# Patient Record
Sex: Male | Born: 1935
Health system: Southern US, Community
[De-identification: ages and names within clinical notes are randomized; demographics above are authoritative.]

## PROBLEM LIST (undated history)

## (undated) DIAGNOSIS — E119 Type 2 diabetes mellitus without complications: Secondary | ICD-10-CM

## (undated) DIAGNOSIS — I10 Essential (primary) hypertension: Secondary | ICD-10-CM

## (undated) DIAGNOSIS — M1990 Unspecified osteoarthritis, unspecified site: Secondary | ICD-10-CM

## (undated) HISTORY — DX: Unspecified osteoarthritis, unspecified site: M19.90

## (undated) HISTORY — DX: Type 2 diabetes mellitus without complications: E11.9

---

## 1998-11-12 ENCOUNTER — Inpatient Hospital Stay (HOSPITAL_COMMUNITY): Admission: EM | Admit: 1998-11-12 | Discharge: 1998-11-16 | Payer: Self-pay

## 1998-11-15 ENCOUNTER — Encounter: Payer: Self-pay | Admitting: Internal Medicine

## 1998-11-20 ENCOUNTER — Encounter: Admission: RE | Admit: 1998-11-20 | Discharge: 1999-02-11 | Payer: Self-pay | Admitting: Family Medicine

## 1998-11-30 ENCOUNTER — Inpatient Hospital Stay (HOSPITAL_COMMUNITY): Admission: EM | Admit: 1998-11-30 | Discharge: 1998-12-05 | Payer: Self-pay | Admitting: Internal Medicine

## 1998-12-04 ENCOUNTER — Encounter: Payer: Self-pay | Admitting: Gastroenterology

## 2009-02-03 ENCOUNTER — Emergency Department (HOSPITAL_COMMUNITY): Admission: EM | Admit: 2009-02-03 | Discharge: 2009-02-03 | Payer: Self-pay | Admitting: Emergency Medicine

## 2011-10-06 DIAGNOSIS — I10 Essential (primary) hypertension: Secondary | ICD-10-CM | POA: Diagnosis not present

## 2011-10-06 DIAGNOSIS — E119 Type 2 diabetes mellitus without complications: Secondary | ICD-10-CM | POA: Diagnosis not present

## 2011-10-06 DIAGNOSIS — E785 Hyperlipidemia, unspecified: Secondary | ICD-10-CM | POA: Diagnosis not present

## 2011-10-21 DIAGNOSIS — D649 Anemia, unspecified: Secondary | ICD-10-CM | POA: Diagnosis not present

## 2011-11-28 DIAGNOSIS — H2510 Age-related nuclear cataract, unspecified eye: Secondary | ICD-10-CM | POA: Diagnosis not present

## 2011-11-28 DIAGNOSIS — E119 Type 2 diabetes mellitus without complications: Secondary | ICD-10-CM | POA: Diagnosis not present

## 2012-04-07 DIAGNOSIS — E119 Type 2 diabetes mellitus without complications: Secondary | ICD-10-CM | POA: Diagnosis not present

## 2012-04-07 DIAGNOSIS — Z79899 Other long term (current) drug therapy: Secondary | ICD-10-CM | POA: Diagnosis not present

## 2012-04-07 DIAGNOSIS — I10 Essential (primary) hypertension: Secondary | ICD-10-CM | POA: Diagnosis not present

## 2012-05-18 DIAGNOSIS — D1801 Hemangioma of skin and subcutaneous tissue: Secondary | ICD-10-CM | POA: Diagnosis not present

## 2012-05-18 DIAGNOSIS — Z85828 Personal history of other malignant neoplasm of skin: Secondary | ICD-10-CM | POA: Diagnosis not present

## 2012-05-18 DIAGNOSIS — L819 Disorder of pigmentation, unspecified: Secondary | ICD-10-CM | POA: Diagnosis not present

## 2012-05-18 DIAGNOSIS — L821 Other seborrheic keratosis: Secondary | ICD-10-CM | POA: Diagnosis not present

## 2012-05-18 DIAGNOSIS — L570 Actinic keratosis: Secondary | ICD-10-CM | POA: Diagnosis not present

## 2012-07-08 DIAGNOSIS — I10 Essential (primary) hypertension: Secondary | ICD-10-CM | POA: Diagnosis not present

## 2012-07-08 DIAGNOSIS — Z79899 Other long term (current) drug therapy: Secondary | ICD-10-CM | POA: Diagnosis not present

## 2012-07-08 DIAGNOSIS — E119 Type 2 diabetes mellitus without complications: Secondary | ICD-10-CM | POA: Diagnosis not present

## 2012-10-06 DIAGNOSIS — E785 Hyperlipidemia, unspecified: Secondary | ICD-10-CM | POA: Diagnosis not present

## 2012-10-06 DIAGNOSIS — Z1331 Encounter for screening for depression: Secondary | ICD-10-CM | POA: Diagnosis not present

## 2012-10-06 DIAGNOSIS — Z23 Encounter for immunization: Secondary | ICD-10-CM | POA: Diagnosis not present

## 2012-10-06 DIAGNOSIS — I10 Essential (primary) hypertension: Secondary | ICD-10-CM | POA: Diagnosis not present

## 2012-10-25 DIAGNOSIS — E119 Type 2 diabetes mellitus without complications: Secondary | ICD-10-CM | POA: Diagnosis not present

## 2012-10-25 DIAGNOSIS — R7989 Other specified abnormal findings of blood chemistry: Secondary | ICD-10-CM | POA: Diagnosis not present

## 2012-11-15 DIAGNOSIS — E119 Type 2 diabetes mellitus without complications: Secondary | ICD-10-CM | POA: Diagnosis not present

## 2012-12-27 DIAGNOSIS — D485 Neoplasm of uncertain behavior of skin: Secondary | ICD-10-CM | POA: Diagnosis not present

## 2012-12-27 DIAGNOSIS — Z85828 Personal history of other malignant neoplasm of skin: Secondary | ICD-10-CM | POA: Diagnosis not present

## 2012-12-27 DIAGNOSIS — C44319 Basal cell carcinoma of skin of other parts of face: Secondary | ICD-10-CM | POA: Diagnosis not present

## 2013-01-13 DIAGNOSIS — C44319 Basal cell carcinoma of skin of other parts of face: Secondary | ICD-10-CM | POA: Diagnosis not present

## 2013-01-13 DIAGNOSIS — Z85828 Personal history of other malignant neoplasm of skin: Secondary | ICD-10-CM | POA: Diagnosis not present

## 2013-04-12 DIAGNOSIS — R7989 Other specified abnormal findings of blood chemistry: Secondary | ICD-10-CM | POA: Diagnosis not present

## 2013-04-12 DIAGNOSIS — E119 Type 2 diabetes mellitus without complications: Secondary | ICD-10-CM | POA: Diagnosis not present

## 2013-04-25 DIAGNOSIS — Z23 Encounter for immunization: Secondary | ICD-10-CM | POA: Diagnosis not present

## 2013-07-11 DIAGNOSIS — Z85828 Personal history of other malignant neoplasm of skin: Secondary | ICD-10-CM | POA: Diagnosis not present

## 2013-07-11 DIAGNOSIS — L570 Actinic keratosis: Secondary | ICD-10-CM | POA: Diagnosis not present

## 2013-07-11 DIAGNOSIS — L821 Other seborrheic keratosis: Secondary | ICD-10-CM | POA: Diagnosis not present

## 2013-09-23 DIAGNOSIS — E1139 Type 2 diabetes mellitus with other diabetic ophthalmic complication: Secondary | ICD-10-CM | POA: Diagnosis not present

## 2013-09-23 DIAGNOSIS — Z23 Encounter for immunization: Secondary | ICD-10-CM | POA: Diagnosis not present

## 2013-09-23 DIAGNOSIS — I10 Essential (primary) hypertension: Secondary | ICD-10-CM | POA: Diagnosis not present

## 2013-09-23 DIAGNOSIS — E7800 Pure hypercholesterolemia, unspecified: Secondary | ICD-10-CM | POA: Diagnosis not present

## 2013-10-11 DIAGNOSIS — E7800 Pure hypercholesterolemia, unspecified: Secondary | ICD-10-CM | POA: Diagnosis not present

## 2013-10-11 DIAGNOSIS — I10 Essential (primary) hypertension: Secondary | ICD-10-CM | POA: Diagnosis not present

## 2013-10-11 DIAGNOSIS — E1139 Type 2 diabetes mellitus with other diabetic ophthalmic complication: Secondary | ICD-10-CM | POA: Diagnosis not present

## 2013-10-11 DIAGNOSIS — Z23 Encounter for immunization: Secondary | ICD-10-CM | POA: Diagnosis not present

## 2013-10-20 DIAGNOSIS — D649 Anemia, unspecified: Secondary | ICD-10-CM | POA: Diagnosis not present

## 2013-10-21 DIAGNOSIS — Z1211 Encounter for screening for malignant neoplasm of colon: Secondary | ICD-10-CM | POA: Diagnosis not present

## 2013-11-15 DIAGNOSIS — H40039 Anatomical narrow angle, unspecified eye: Secondary | ICD-10-CM | POA: Diagnosis not present

## 2013-11-15 DIAGNOSIS — E119 Type 2 diabetes mellitus without complications: Secondary | ICD-10-CM | POA: Diagnosis not present

## 2013-11-22 ENCOUNTER — Other Ambulatory Visit: Payer: Self-pay | Admitting: Gastroenterology

## 2013-11-22 DIAGNOSIS — D500 Iron deficiency anemia secondary to blood loss (chronic): Secondary | ICD-10-CM | POA: Diagnosis not present

## 2013-11-22 DIAGNOSIS — Z800 Family history of malignant neoplasm of digestive organs: Secondary | ICD-10-CM | POA: Diagnosis not present

## 2013-11-22 DIAGNOSIS — R109 Unspecified abdominal pain: Secondary | ICD-10-CM

## 2013-11-22 DIAGNOSIS — D649 Anemia, unspecified: Secondary | ICD-10-CM

## 2013-11-25 ENCOUNTER — Ambulatory Visit
Admission: RE | Admit: 2013-11-25 | Discharge: 2013-11-25 | Disposition: A | Discharge status: Home / Self Care | Payer: Medicare Other | Source: Ambulatory Visit | Attending: Gastroenterology | Admitting: Gastroenterology

## 2013-11-25 DIAGNOSIS — D649 Anemia, unspecified: Secondary | ICD-10-CM

## 2013-11-25 DIAGNOSIS — N400 Enlarged prostate without lower urinary tract symptoms: Secondary | ICD-10-CM | POA: Diagnosis not present

## 2013-11-25 DIAGNOSIS — R109 Unspecified abdominal pain: Secondary | ICD-10-CM

## 2013-11-25 DIAGNOSIS — K838 Other specified diseases of biliary tract: Secondary | ICD-10-CM | POA: Diagnosis not present

## 2013-11-25 MED ORDER — IOHEXOL 300 MG/ML  SOLN
100.0000 mL | Freq: Once | INTRAMUSCULAR | Status: AC | PRN
  Administered 2013-11-25: 100 mL via INTRAVENOUS

## 2013-12-21 DIAGNOSIS — Z23 Encounter for immunization: Secondary | ICD-10-CM | POA: Diagnosis not present

## 2013-12-21 DIAGNOSIS — D649 Anemia, unspecified: Secondary | ICD-10-CM | POA: Diagnosis not present

## 2013-12-21 DIAGNOSIS — I10 Essential (primary) hypertension: Secondary | ICD-10-CM | POA: Diagnosis not present

## 2013-12-21 DIAGNOSIS — E1139 Type 2 diabetes mellitus with other diabetic ophthalmic complication: Secondary | ICD-10-CM | POA: Diagnosis not present

## 2014-01-02 DIAGNOSIS — L821 Other seborrheic keratosis: Secondary | ICD-10-CM | POA: Diagnosis not present

## 2014-01-02 DIAGNOSIS — Z85828 Personal history of other malignant neoplasm of skin: Secondary | ICD-10-CM | POA: Diagnosis not present

## 2014-01-02 DIAGNOSIS — L570 Actinic keratosis: Secondary | ICD-10-CM | POA: Diagnosis not present

## 2014-01-10 DIAGNOSIS — D500 Iron deficiency anemia secondary to blood loss (chronic): Secondary | ICD-10-CM | POA: Diagnosis not present

## 2014-01-10 DIAGNOSIS — R932 Abnormal findings on diagnostic imaging of liver and biliary tract: Secondary | ICD-10-CM | POA: Diagnosis not present

## 2014-01-24 DIAGNOSIS — R932 Abnormal findings on diagnostic imaging of liver and biliary tract: Secondary | ICD-10-CM | POA: Diagnosis not present

## 2014-01-24 DIAGNOSIS — D500 Iron deficiency anemia secondary to blood loss (chronic): Secondary | ICD-10-CM | POA: Diagnosis not present

## 2014-03-21 DIAGNOSIS — D500 Iron deficiency anemia secondary to blood loss (chronic): Secondary | ICD-10-CM | POA: Diagnosis not present

## 2014-04-12 DIAGNOSIS — E7800 Pure hypercholesterolemia, unspecified: Secondary | ICD-10-CM | POA: Diagnosis not present

## 2014-04-14 DIAGNOSIS — E139 Other specified diabetes mellitus without complications: Secondary | ICD-10-CM | POA: Diagnosis not present

## 2014-05-03 DIAGNOSIS — Z23 Encounter for immunization: Secondary | ICD-10-CM | POA: Diagnosis not present

## 2014-07-17 DIAGNOSIS — Z85828 Personal history of other malignant neoplasm of skin: Secondary | ICD-10-CM | POA: Diagnosis not present

## 2014-07-17 DIAGNOSIS — D1801 Hemangioma of skin and subcutaneous tissue: Secondary | ICD-10-CM | POA: Diagnosis not present

## 2014-07-17 DIAGNOSIS — L570 Actinic keratosis: Secondary | ICD-10-CM | POA: Diagnosis not present

## 2014-07-17 DIAGNOSIS — C44519 Basal cell carcinoma of skin of other part of trunk: Secondary | ICD-10-CM | POA: Diagnosis not present

## 2014-07-17 DIAGNOSIS — D485 Neoplasm of uncertain behavior of skin: Secondary | ICD-10-CM | POA: Diagnosis not present

## 2014-07-17 DIAGNOSIS — L812 Freckles: Secondary | ICD-10-CM | POA: Diagnosis not present

## 2014-07-17 DIAGNOSIS — L821 Other seborrheic keratosis: Secondary | ICD-10-CM | POA: Diagnosis not present

## 2014-09-21 DIAGNOSIS — I10 Essential (primary) hypertension: Secondary | ICD-10-CM | POA: Diagnosis not present

## 2014-09-21 DIAGNOSIS — E782 Mixed hyperlipidemia: Secondary | ICD-10-CM | POA: Diagnosis not present

## 2014-09-21 DIAGNOSIS — E119 Type 2 diabetes mellitus without complications: Secondary | ICD-10-CM | POA: Diagnosis not present

## 2014-09-26 DIAGNOSIS — H60393 Other infective otitis externa, bilateral: Secondary | ICD-10-CM | POA: Diagnosis not present

## 2014-09-26 DIAGNOSIS — H9193 Unspecified hearing loss, bilateral: Secondary | ICD-10-CM | POA: Diagnosis not present

## 2014-09-28 DIAGNOSIS — D649 Anemia, unspecified: Secondary | ICD-10-CM | POA: Diagnosis not present

## 2014-11-14 DIAGNOSIS — H40033 Anatomical narrow angle, bilateral: Secondary | ICD-10-CM | POA: Diagnosis not present

## 2014-11-14 DIAGNOSIS — H04123 Dry eye syndrome of bilateral lacrimal glands: Secondary | ICD-10-CM | POA: Diagnosis not present

## 2015-01-02 DIAGNOSIS — D649 Anemia, unspecified: Secondary | ICD-10-CM | POA: Diagnosis not present

## 2015-01-05 DIAGNOSIS — D500 Iron deficiency anemia secondary to blood loss (chronic): Secondary | ICD-10-CM | POA: Diagnosis not present

## 2015-01-12 DIAGNOSIS — D500 Iron deficiency anemia secondary to blood loss (chronic): Secondary | ICD-10-CM | POA: Diagnosis not present

## 2015-01-23 DIAGNOSIS — L821 Other seborrheic keratosis: Secondary | ICD-10-CM | POA: Diagnosis not present

## 2015-01-23 DIAGNOSIS — D1801 Hemangioma of skin and subcutaneous tissue: Secondary | ICD-10-CM | POA: Diagnosis not present

## 2015-01-23 DIAGNOSIS — Z85828 Personal history of other malignant neoplasm of skin: Secondary | ICD-10-CM | POA: Diagnosis not present

## 2015-01-23 DIAGNOSIS — L820 Inflamed seborrheic keratosis: Secondary | ICD-10-CM | POA: Diagnosis not present

## 2015-03-26 DIAGNOSIS — M25562 Pain in left knee: Secondary | ICD-10-CM | POA: Diagnosis not present

## 2015-03-26 DIAGNOSIS — M170 Bilateral primary osteoarthritis of knee: Secondary | ICD-10-CM | POA: Diagnosis not present

## 2015-03-26 DIAGNOSIS — M25561 Pain in right knee: Secondary | ICD-10-CM | POA: Diagnosis not present

## 2015-03-26 DIAGNOSIS — R262 Difficulty in walking, not elsewhere classified: Secondary | ICD-10-CM | POA: Diagnosis not present

## 2015-04-05 DIAGNOSIS — M25562 Pain in left knee: Secondary | ICD-10-CM | POA: Diagnosis not present

## 2015-04-05 DIAGNOSIS — M1712 Unilateral primary osteoarthritis, left knee: Secondary | ICD-10-CM | POA: Diagnosis not present

## 2015-04-05 DIAGNOSIS — R2689 Other abnormalities of gait and mobility: Secondary | ICD-10-CM | POA: Diagnosis not present

## 2015-04-05 DIAGNOSIS — M25561 Pain in right knee: Secondary | ICD-10-CM | POA: Diagnosis not present

## 2015-04-05 DIAGNOSIS — M170 Bilateral primary osteoarthritis of knee: Secondary | ICD-10-CM | POA: Diagnosis not present

## 2015-04-13 DIAGNOSIS — M25562 Pain in left knee: Secondary | ICD-10-CM | POA: Diagnosis not present

## 2015-04-13 DIAGNOSIS — M170 Bilateral primary osteoarthritis of knee: Secondary | ICD-10-CM | POA: Diagnosis not present

## 2015-04-13 DIAGNOSIS — R2689 Other abnormalities of gait and mobility: Secondary | ICD-10-CM | POA: Diagnosis not present

## 2015-04-13 DIAGNOSIS — M1712 Unilateral primary osteoarthritis, left knee: Secondary | ICD-10-CM | POA: Diagnosis not present

## 2015-04-13 DIAGNOSIS — M25561 Pain in right knee: Secondary | ICD-10-CM | POA: Diagnosis not present

## 2015-04-13 DIAGNOSIS — R262 Difficulty in walking, not elsewhere classified: Secondary | ICD-10-CM | POA: Diagnosis not present

## 2015-04-13 DIAGNOSIS — M1711 Unilateral primary osteoarthritis, right knee: Secondary | ICD-10-CM | POA: Diagnosis not present

## 2015-04-17 DIAGNOSIS — R2689 Other abnormalities of gait and mobility: Secondary | ICD-10-CM | POA: Diagnosis not present

## 2015-04-17 DIAGNOSIS — M170 Bilateral primary osteoarthritis of knee: Secondary | ICD-10-CM | POA: Diagnosis not present

## 2015-04-17 DIAGNOSIS — M25561 Pain in right knee: Secondary | ICD-10-CM | POA: Diagnosis not present

## 2015-04-17 DIAGNOSIS — M25562 Pain in left knee: Secondary | ICD-10-CM | POA: Diagnosis not present

## 2015-04-17 DIAGNOSIS — M1712 Unilateral primary osteoarthritis, left knee: Secondary | ICD-10-CM | POA: Diagnosis not present

## 2015-04-23 DIAGNOSIS — M1711 Unilateral primary osteoarthritis, right knee: Secondary | ICD-10-CM | POA: Diagnosis not present

## 2015-04-23 DIAGNOSIS — M25561 Pain in right knee: Secondary | ICD-10-CM | POA: Diagnosis not present

## 2015-04-23 DIAGNOSIS — R269 Unspecified abnormalities of gait and mobility: Secondary | ICD-10-CM | POA: Diagnosis not present

## 2015-04-23 DIAGNOSIS — M25562 Pain in left knee: Secondary | ICD-10-CM | POA: Diagnosis not present

## 2015-04-23 DIAGNOSIS — M170 Bilateral primary osteoarthritis of knee: Secondary | ICD-10-CM | POA: Diagnosis not present

## 2015-04-26 DIAGNOSIS — M25562 Pain in left knee: Secondary | ICD-10-CM | POA: Diagnosis not present

## 2015-04-26 DIAGNOSIS — M1712 Unilateral primary osteoarthritis, left knee: Secondary | ICD-10-CM | POA: Diagnosis not present

## 2015-05-01 DIAGNOSIS — M1711 Unilateral primary osteoarthritis, right knee: Secondary | ICD-10-CM | POA: Diagnosis not present

## 2015-05-01 DIAGNOSIS — M25561 Pain in right knee: Secondary | ICD-10-CM | POA: Diagnosis not present

## 2015-05-04 DIAGNOSIS — Z23 Encounter for immunization: Secondary | ICD-10-CM | POA: Diagnosis not present

## 2015-05-10 DIAGNOSIS — R2689 Other abnormalities of gait and mobility: Secondary | ICD-10-CM | POA: Diagnosis not present

## 2015-05-10 DIAGNOSIS — M170 Bilateral primary osteoarthritis of knee: Secondary | ICD-10-CM | POA: Diagnosis not present

## 2015-05-10 DIAGNOSIS — M25562 Pain in left knee: Secondary | ICD-10-CM | POA: Diagnosis not present

## 2015-05-10 DIAGNOSIS — M1712 Unilateral primary osteoarthritis, left knee: Secondary | ICD-10-CM | POA: Diagnosis not present

## 2015-05-10 DIAGNOSIS — M25561 Pain in right knee: Secondary | ICD-10-CM | POA: Diagnosis not present

## 2015-05-18 DIAGNOSIS — M1711 Unilateral primary osteoarthritis, right knee: Secondary | ICD-10-CM | POA: Diagnosis not present

## 2015-05-18 DIAGNOSIS — M25561 Pain in right knee: Secondary | ICD-10-CM | POA: Diagnosis not present

## 2015-05-18 DIAGNOSIS — M170 Bilateral primary osteoarthritis of knee: Secondary | ICD-10-CM | POA: Diagnosis not present

## 2015-05-18 DIAGNOSIS — M25562 Pain in left knee: Secondary | ICD-10-CM | POA: Diagnosis not present

## 2015-05-18 DIAGNOSIS — R2689 Other abnormalities of gait and mobility: Secondary | ICD-10-CM | POA: Diagnosis not present

## 2015-08-13 DIAGNOSIS — L821 Other seborrheic keratosis: Secondary | ICD-10-CM | POA: Diagnosis not present

## 2015-08-13 DIAGNOSIS — C44510 Basal cell carcinoma of anal skin: Secondary | ICD-10-CM | POA: Diagnosis not present

## 2015-08-13 DIAGNOSIS — L218 Other seborrheic dermatitis: Secondary | ICD-10-CM | POA: Diagnosis not present

## 2015-08-13 DIAGNOSIS — L910 Hypertrophic scar: Secondary | ICD-10-CM | POA: Diagnosis not present

## 2015-08-13 DIAGNOSIS — Z85828 Personal history of other malignant neoplasm of skin: Secondary | ICD-10-CM | POA: Diagnosis not present

## 2015-08-13 DIAGNOSIS — D692 Other nonthrombocytopenic purpura: Secondary | ICD-10-CM | POA: Diagnosis not present

## 2015-08-13 DIAGNOSIS — D485 Neoplasm of uncertain behavior of skin: Secondary | ICD-10-CM | POA: Diagnosis not present

## 2015-08-13 DIAGNOSIS — C44519 Basal cell carcinoma of skin of other part of trunk: Secondary | ICD-10-CM | POA: Diagnosis not present

## 2015-10-18 DIAGNOSIS — D649 Anemia, unspecified: Secondary | ICD-10-CM | POA: Diagnosis not present

## 2015-10-18 DIAGNOSIS — E782 Mixed hyperlipidemia: Secondary | ICD-10-CM | POA: Diagnosis not present

## 2015-10-18 DIAGNOSIS — R809 Proteinuria, unspecified: Secondary | ICD-10-CM | POA: Diagnosis not present

## 2015-10-18 DIAGNOSIS — E119 Type 2 diabetes mellitus without complications: Secondary | ICD-10-CM | POA: Diagnosis not present

## 2015-10-18 DIAGNOSIS — Z7984 Long term (current) use of oral hypoglycemic drugs: Secondary | ICD-10-CM | POA: Diagnosis not present

## 2015-10-18 DIAGNOSIS — I10 Essential (primary) hypertension: Secondary | ICD-10-CM | POA: Diagnosis not present

## 2015-11-13 DIAGNOSIS — H40033 Anatomical narrow angle, bilateral: Secondary | ICD-10-CM | POA: Diagnosis not present

## 2015-11-13 DIAGNOSIS — E119 Type 2 diabetes mellitus without complications: Secondary | ICD-10-CM | POA: Diagnosis not present

## 2016-02-11 DIAGNOSIS — C44519 Basal cell carcinoma of skin of other part of trunk: Secondary | ICD-10-CM | POA: Diagnosis not present

## 2016-02-11 DIAGNOSIS — L821 Other seborrheic keratosis: Secondary | ICD-10-CM | POA: Diagnosis not present

## 2016-02-11 DIAGNOSIS — C44219 Basal cell carcinoma of skin of left ear and external auricular canal: Secondary | ICD-10-CM | POA: Diagnosis not present

## 2016-02-11 DIAGNOSIS — L812 Freckles: Secondary | ICD-10-CM | POA: Diagnosis not present

## 2016-02-11 DIAGNOSIS — D485 Neoplasm of uncertain behavior of skin: Secondary | ICD-10-CM | POA: Diagnosis not present

## 2016-02-11 DIAGNOSIS — L308 Other specified dermatitis: Secondary | ICD-10-CM | POA: Diagnosis not present

## 2016-02-11 DIAGNOSIS — D1801 Hemangioma of skin and subcutaneous tissue: Secondary | ICD-10-CM | POA: Diagnosis not present

## 2016-02-11 DIAGNOSIS — Z85828 Personal history of other malignant neoplasm of skin: Secondary | ICD-10-CM | POA: Diagnosis not present

## 2016-02-13 DIAGNOSIS — R809 Proteinuria, unspecified: Secondary | ICD-10-CM | POA: Diagnosis not present

## 2016-02-13 DIAGNOSIS — E119 Type 2 diabetes mellitus without complications: Secondary | ICD-10-CM | POA: Diagnosis not present

## 2016-02-13 DIAGNOSIS — I10 Essential (primary) hypertension: Secondary | ICD-10-CM | POA: Diagnosis not present

## 2016-02-13 DIAGNOSIS — Z7984 Long term (current) use of oral hypoglycemic drugs: Secondary | ICD-10-CM | POA: Diagnosis not present

## 2016-02-13 DIAGNOSIS — E782 Mixed hyperlipidemia: Secondary | ICD-10-CM | POA: Diagnosis not present

## 2016-02-13 DIAGNOSIS — D649 Anemia, unspecified: Secondary | ICD-10-CM | POA: Diagnosis not present

## 2016-02-18 DIAGNOSIS — Z85828 Personal history of other malignant neoplasm of skin: Secondary | ICD-10-CM | POA: Diagnosis not present

## 2016-02-18 DIAGNOSIS — D485 Neoplasm of uncertain behavior of skin: Secondary | ICD-10-CM | POA: Diagnosis not present

## 2016-02-18 DIAGNOSIS — L821 Other seborrheic keratosis: Secondary | ICD-10-CM | POA: Diagnosis not present

## 2016-04-22 DIAGNOSIS — Z23 Encounter for immunization: Secondary | ICD-10-CM | POA: Diagnosis not present

## 2016-05-10 DIAGNOSIS — H9192 Unspecified hearing loss, left ear: Secondary | ICD-10-CM | POA: Diagnosis not present

## 2016-05-10 DIAGNOSIS — H6122 Impacted cerumen, left ear: Secondary | ICD-10-CM | POA: Diagnosis not present

## 2016-05-24 DIAGNOSIS — H6122 Impacted cerumen, left ear: Secondary | ICD-10-CM | POA: Diagnosis not present

## 2016-05-24 DIAGNOSIS — H6980 Other specified disorders of Eustachian tube, unspecified ear: Secondary | ICD-10-CM | POA: Diagnosis not present

## 2016-06-03 DIAGNOSIS — E119 Type 2 diabetes mellitus without complications: Secondary | ICD-10-CM | POA: Diagnosis not present

## 2016-06-03 DIAGNOSIS — Z7984 Long term (current) use of oral hypoglycemic drugs: Secondary | ICD-10-CM | POA: Diagnosis not present

## 2016-06-03 DIAGNOSIS — E782 Mixed hyperlipidemia: Secondary | ICD-10-CM | POA: Diagnosis not present

## 2016-06-09 DIAGNOSIS — E119 Type 2 diabetes mellitus without complications: Secondary | ICD-10-CM | POA: Diagnosis not present

## 2016-06-09 DIAGNOSIS — E782 Mixed hyperlipidemia: Secondary | ICD-10-CM | POA: Diagnosis not present

## 2016-06-09 DIAGNOSIS — Z7984 Long term (current) use of oral hypoglycemic drugs: Secondary | ICD-10-CM | POA: Diagnosis not present

## 2016-08-11 DIAGNOSIS — D1801 Hemangioma of skin and subcutaneous tissue: Secondary | ICD-10-CM | POA: Diagnosis not present

## 2016-08-11 DIAGNOSIS — D485 Neoplasm of uncertain behavior of skin: Secondary | ICD-10-CM | POA: Diagnosis not present

## 2016-08-11 DIAGNOSIS — D044 Carcinoma in situ of skin of scalp and neck: Secondary | ICD-10-CM | POA: Diagnosis not present

## 2016-08-11 DIAGNOSIS — L570 Actinic keratosis: Secondary | ICD-10-CM | POA: Diagnosis not present

## 2016-08-11 DIAGNOSIS — L821 Other seborrheic keratosis: Secondary | ICD-10-CM | POA: Diagnosis not present

## 2016-08-11 DIAGNOSIS — Z85828 Personal history of other malignant neoplasm of skin: Secondary | ICD-10-CM | POA: Diagnosis not present

## 2016-10-02 DIAGNOSIS — H6123 Impacted cerumen, bilateral: Secondary | ICD-10-CM | POA: Diagnosis not present

## 2016-11-11 DIAGNOSIS — H40033 Anatomical narrow angle, bilateral: Secondary | ICD-10-CM | POA: Diagnosis not present

## 2016-11-11 DIAGNOSIS — H04123 Dry eye syndrome of bilateral lacrimal glands: Secondary | ICD-10-CM | POA: Diagnosis not present

## 2016-12-01 DIAGNOSIS — I10 Essential (primary) hypertension: Secondary | ICD-10-CM | POA: Diagnosis not present

## 2016-12-01 DIAGNOSIS — Z7984 Long term (current) use of oral hypoglycemic drugs: Secondary | ICD-10-CM | POA: Diagnosis not present

## 2016-12-01 DIAGNOSIS — E782 Mixed hyperlipidemia: Secondary | ICD-10-CM | POA: Diagnosis not present

## 2016-12-01 DIAGNOSIS — E119 Type 2 diabetes mellitus without complications: Secondary | ICD-10-CM | POA: Diagnosis not present

## 2017-02-16 DIAGNOSIS — L812 Freckles: Secondary | ICD-10-CM | POA: Diagnosis not present

## 2017-02-16 DIAGNOSIS — L853 Xerosis cutis: Secondary | ICD-10-CM | POA: Diagnosis not present

## 2017-02-16 DIAGNOSIS — D1801 Hemangioma of skin and subcutaneous tissue: Secondary | ICD-10-CM | POA: Diagnosis not present

## 2017-02-16 DIAGNOSIS — L821 Other seborrheic keratosis: Secondary | ICD-10-CM | POA: Diagnosis not present

## 2017-02-16 DIAGNOSIS — L570 Actinic keratosis: Secondary | ICD-10-CM | POA: Diagnosis not present

## 2017-02-16 DIAGNOSIS — Z85828 Personal history of other malignant neoplasm of skin: Secondary | ICD-10-CM | POA: Diagnosis not present

## 2017-04-22 DIAGNOSIS — Z23 Encounter for immunization: Secondary | ICD-10-CM | POA: Diagnosis not present

## 2017-07-13 DIAGNOSIS — R609 Edema, unspecified: Secondary | ICD-10-CM | POA: Diagnosis not present

## 2017-07-13 DIAGNOSIS — R208 Other disturbances of skin sensation: Secondary | ICD-10-CM | POA: Diagnosis not present

## 2017-07-24 DIAGNOSIS — Z23 Encounter for immunization: Secondary | ICD-10-CM | POA: Diagnosis not present

## 2017-07-24 DIAGNOSIS — E119 Type 2 diabetes mellitus without complications: Secondary | ICD-10-CM | POA: Diagnosis not present

## 2017-07-24 DIAGNOSIS — E782 Mixed hyperlipidemia: Secondary | ICD-10-CM | POA: Diagnosis not present

## 2017-07-24 DIAGNOSIS — H9190 Unspecified hearing loss, unspecified ear: Secondary | ICD-10-CM | POA: Diagnosis not present

## 2017-07-24 DIAGNOSIS — Z7984 Long term (current) use of oral hypoglycemic drugs: Secondary | ICD-10-CM | POA: Diagnosis not present

## 2017-07-24 DIAGNOSIS — I10 Essential (primary) hypertension: Secondary | ICD-10-CM | POA: Diagnosis not present

## 2017-07-24 DIAGNOSIS — Z0000 Encounter for general adult medical examination without abnormal findings: Secondary | ICD-10-CM | POA: Diagnosis not present

## 2017-07-24 DIAGNOSIS — H6120 Impacted cerumen, unspecified ear: Secondary | ICD-10-CM | POA: Diagnosis not present

## 2017-08-27 DIAGNOSIS — L821 Other seborrheic keratosis: Secondary | ICD-10-CM | POA: Diagnosis not present

## 2017-08-27 DIAGNOSIS — L812 Freckles: Secondary | ICD-10-CM | POA: Diagnosis not present

## 2017-08-27 DIAGNOSIS — D692 Other nonthrombocytopenic purpura: Secondary | ICD-10-CM | POA: Diagnosis not present

## 2017-08-27 DIAGNOSIS — Z85828 Personal history of other malignant neoplasm of skin: Secondary | ICD-10-CM | POA: Diagnosis not present

## 2017-08-27 DIAGNOSIS — D1801 Hemangioma of skin and subcutaneous tissue: Secondary | ICD-10-CM | POA: Diagnosis not present

## 2017-08-27 DIAGNOSIS — L570 Actinic keratosis: Secondary | ICD-10-CM | POA: Diagnosis not present

## 2017-11-10 DIAGNOSIS — H04123 Dry eye syndrome of bilateral lacrimal glands: Secondary | ICD-10-CM | POA: Diagnosis not present

## 2017-11-10 DIAGNOSIS — H40033 Anatomical narrow angle, bilateral: Secondary | ICD-10-CM | POA: Diagnosis not present

## 2017-12-03 DIAGNOSIS — W57XXXA Bitten or stung by nonvenomous insect and other nonvenomous arthropods, initial encounter: Secondary | ICD-10-CM | POA: Diagnosis not present

## 2017-12-03 DIAGNOSIS — S30860A Insect bite (nonvenomous) of lower back and pelvis, initial encounter: Secondary | ICD-10-CM | POA: Diagnosis not present

## 2018-02-03 DIAGNOSIS — H9190 Unspecified hearing loss, unspecified ear: Secondary | ICD-10-CM | POA: Diagnosis not present

## 2018-02-03 DIAGNOSIS — E1169 Type 2 diabetes mellitus with other specified complication: Secondary | ICD-10-CM | POA: Diagnosis not present

## 2018-02-03 DIAGNOSIS — E782 Mixed hyperlipidemia: Secondary | ICD-10-CM | POA: Diagnosis not present

## 2018-02-03 DIAGNOSIS — I10 Essential (primary) hypertension: Secondary | ICD-10-CM | POA: Diagnosis not present

## 2018-02-03 DIAGNOSIS — M179 Osteoarthritis of knee, unspecified: Secondary | ICD-10-CM | POA: Diagnosis not present

## 2018-02-24 ENCOUNTER — Ambulatory Visit (INDEPENDENT_AMBULATORY_CARE_PROVIDER_SITE_OTHER): Payer: Medicare Other

## 2018-02-24 ENCOUNTER — Ambulatory Visit (INDEPENDENT_AMBULATORY_CARE_PROVIDER_SITE_OTHER): Payer: Medicare Other | Admitting: Sports Medicine

## 2018-02-24 ENCOUNTER — Encounter: Payer: Self-pay | Admitting: Sports Medicine

## 2018-02-24 DIAGNOSIS — M170 Bilateral primary osteoarthritis of knee: Secondary | ICD-10-CM

## 2018-02-24 MED ORDER — CELECOXIB 200 MG PO CAPS: ORAL_CAPSULE | ORAL | 2 refills | Status: DC

## 2018-02-24 NOTE — Assessment & Plan Note (Addendum)
Went to [facility], had a series of Supartz into both knees with only a few weeks of relief. Has not yet been on anti-inflammatories, has not had steroid injections, has not had physical therapy. Bilateral steroid injection today, Celebrex, home health physical therapy. Baseline x-rays.   We discussed the experimental nature of stem cell injections, and how it is not covered by insurance.   We discussed how it is probably a better option to do tried and true, proven modalities before considering something experimental. Return in 1 month.

## 2018-02-24 NOTE — Progress Notes (Signed)
Subjective:    CC: Bilateral knee pain  HPI:  Darin Moran is a very pleasant 84 year old male, he has known knee osteoarthritis.  He went to a local clinic and had a 5 shot Visco supplementation series that only provided a few weeks of relief.  He has not tried NSAIDs, and has not had steroid injections or physical therapy.  He only has minimal pain, more gelling and instability when standing.  No mechanical symptoms, no trauma, no constitutional symptoms.  I reviewed the past medical history, family history, social history, surgical history, and allergies today and no changes were needed.  Please see the problem list section below in epic for further details.  Past Medical History: Past Medical History:  Diagnosis Date  . Arthritis   . Diabetes mellitus without complication [facility])    Past Surgical History: History reviewed. No pertinent surgical history. Social History: Social History   Socioeconomic History  . Marital status: Married    Spouse name: Not on file  . Number of children: Not on file  . Years of education: Not on file  . Highest education level: Not on file  Occupational History  . Not on file  Social Needs  . Financial resource strain: Not on file  . Food insecurity:    Worry: Not on file    Inability: Not on file  . Transportation needs:    Medical: Not on file    Non-medical: Not on file  Tobacco Use  . Smoking status: Former Research scientist (life sciences)  . Smokeless tobacco: Never Used  Substance and Sexual Activity  . Alcohol use: Never    Frequency: Never  . Drug use: Never  . Sexual activity: Not Currently  Lifestyle  . Physical activity:    Days per week: Not on file    Minutes per session: Not on file  . Stress: Not on file  Relationships  . Social connections:    Talks on phone: Not on file    Gets together: Not on file    Attends religious service: Not on file    Active member of club or organization: Not on file    Attends meetings of clubs or organizations: Not  on file    Relationship status: Not on file  Other Topics Concern  . Not on file  Social History Narrative  . Not on file   Family History: Family History  Problem Relation Age of Onset  . Cancer Father        colon   Allergies: No Known Allergies Medications: See med rec.  Review of Systems: No headache, visual changes, nausea, vomiting, diarrhea, constipation, dizziness, abdominal pain, skin rash, fevers, chills, night sweats, swollen lymph nodes, weight loss, chest pain, body aches, joint swelling, muscle aches, shortness of breath, mood changes, visual or auditory hallucinations.  Objective:    General: Well Developed, well nourished, and in no acute distress.  Neuro: Alert and oriented x3, extra-ocular muscles intact, sensation grossly intact.  HEENT: Normocephalic, atraumatic, pupils equal round reactive to light, neck supple, no masses, no lymphadenopathy, thyroid nonpalpable.  Skin: Warm and dry, no rashes noted.  Cardiac: Regular rate and rhythm, no murmurs rubs or gallops.  Respiratory: Clear to auscultation bilaterally. Not using accessory muscles, speaking in full sentences.  Abdominal: Soft, nontender, nondistended, positive bowel sounds, no masses, no organomegaly.  Bilateral knees: Mild effusion with mild tenderness of the medial joint line ROM normal in flexion and extension and lower leg rotation. Ligaments with solid consistent endpoints including ACL,  PCL, LCL, MCL. Negative Mcmurray's and provocative meniscal tests. Non painful patellar compression. Patellar and quadriceps tendons unremarkable. Hamstring and quadriceps strength is normal.  Procedure: Real-time Ultrasound Guided Injection of right knee Device: GE Logiq E  Verbal informed consent obtained.  Time-out conducted.  Noted no overlying erythema, induration, or other signs of local infection.  Skin prepped in a sterile fashion.  Local anesthesia: Topical Ethyl chloride.  With sterile technique  and under real time ultrasound guidance:   1 cc kenalog 40, 2 cc lidocaine, 2 cc bupivacaine injected easily Completed without difficulty  Pain immediately resolved suggesting accurate placement of the medication.  Advised to call if fevers/chills, erythema, induration, drainage, or persistent bleeding.  Images permanently stored and available for review in the ultrasound unit.  Impression: Technically successful ultrasound guided injection.  Procedure: Real-time Ultrasound Guided Injection of left knee Device: GE Logiq E  Verbal informed consent obtained.  Time-out conducted.  Noted no overlying erythema, induration, or other signs of local infection.  Skin prepped in a sterile fashion.  Local anesthesia: Topical Ethyl chloride.  With sterile technique and under real time ultrasound guidance:   1 cc kenalog 40, 2 cc lidocaine, 2 cc bupivacaine injected easily Completed without difficulty  Pain immediately resolved suggesting accurate placement of the medication.  Advised to call if fevers/chills, erythema, induration, drainage, or persistent bleeding.  Images permanently stored and available for review in the ultrasound unit.  Impression: Technically successful ultrasound guided injection.  Impression and Recommendations:    The patient was counselled, risk factors were discussed, anticipatory guidance given.  Primary osteoarthritis of both knees Went to [facility], had a series of Supartz into both knees with only a few weeks of relief. Has not yet been on anti-inflammatories, has not had steroid injections, has not had physical therapy. Bilateral steroid injection today, Celebrex, home health physical therapy. Baseline x-rays.   We discussed the experimental nature of stem cell injections, and how it is not covered by insurance.   We discussed how it is probably a better option to do tried and true, proven modalities before considering something experimental. Return in 1  month. ___________________________________________ Gwen Her. Dianah Field, M.D., ABFM., CAQSM. Primary Care and [place] Instructor of [place] of [facility] of Medicine

## 2018-02-25 DIAGNOSIS — M170 Bilateral primary osteoarthritis of knee: Secondary | ICD-10-CM | POA: Diagnosis not present

## 2018-02-25 DIAGNOSIS — G8929 Other chronic pain: Secondary | ICD-10-CM | POA: Diagnosis not present

## 2018-02-25 DIAGNOSIS — R2689 Other abnormalities of gait and mobility: Secondary | ICD-10-CM | POA: Diagnosis not present

## 2018-02-25 DIAGNOSIS — R54 Age-related physical debility: Secondary | ICD-10-CM | POA: Diagnosis not present

## 2018-02-25 DIAGNOSIS — Z87891 Personal history of nicotine dependence: Secondary | ICD-10-CM | POA: Diagnosis not present

## 2018-02-25 DIAGNOSIS — E119 Type 2 diabetes mellitus without complications: Secondary | ICD-10-CM | POA: Diagnosis not present

## 2018-02-26 DIAGNOSIS — R54 Age-related physical debility: Secondary | ICD-10-CM | POA: Diagnosis not present

## 2018-02-26 DIAGNOSIS — Z87891 Personal history of nicotine dependence: Secondary | ICD-10-CM | POA: Diagnosis not present

## 2018-02-26 DIAGNOSIS — G8929 Other chronic pain: Secondary | ICD-10-CM | POA: Diagnosis not present

## 2018-02-26 DIAGNOSIS — R2689 Other abnormalities of gait and mobility: Secondary | ICD-10-CM | POA: Diagnosis not present

## 2018-02-26 DIAGNOSIS — E119 Type 2 diabetes mellitus without complications: Secondary | ICD-10-CM | POA: Diagnosis not present

## 2018-02-26 DIAGNOSIS — M170 Bilateral primary osteoarthritis of knee: Secondary | ICD-10-CM | POA: Diagnosis not present

## 2018-03-01 DIAGNOSIS — R54 Age-related physical debility: Secondary | ICD-10-CM | POA: Diagnosis not present

## 2018-03-01 DIAGNOSIS — G8929 Other chronic pain: Secondary | ICD-10-CM | POA: Diagnosis not present

## 2018-03-01 DIAGNOSIS — Z87891 Personal history of nicotine dependence: Secondary | ICD-10-CM | POA: Diagnosis not present

## 2018-03-01 DIAGNOSIS — R2689 Other abnormalities of gait and mobility: Secondary | ICD-10-CM | POA: Diagnosis not present

## 2018-03-01 DIAGNOSIS — M170 Bilateral primary osteoarthritis of knee: Secondary | ICD-10-CM | POA: Diagnosis not present

## 2018-03-01 DIAGNOSIS — E119 Type 2 diabetes mellitus without complications: Secondary | ICD-10-CM | POA: Diagnosis not present

## 2018-03-02 DIAGNOSIS — E119 Type 2 diabetes mellitus without complications: Secondary | ICD-10-CM | POA: Diagnosis not present

## 2018-03-02 DIAGNOSIS — R2689 Other abnormalities of gait and mobility: Secondary | ICD-10-CM | POA: Diagnosis not present

## 2018-03-02 DIAGNOSIS — M170 Bilateral primary osteoarthritis of knee: Secondary | ICD-10-CM | POA: Diagnosis not present

## 2018-03-02 DIAGNOSIS — R54 Age-related physical debility: Secondary | ICD-10-CM | POA: Diagnosis not present

## 2018-03-02 DIAGNOSIS — Z87891 Personal history of nicotine dependence: Secondary | ICD-10-CM | POA: Diagnosis not present

## 2018-03-02 DIAGNOSIS — G8929 Other chronic pain: Secondary | ICD-10-CM | POA: Diagnosis not present

## 2018-03-04 DIAGNOSIS — G8929 Other chronic pain: Secondary | ICD-10-CM | POA: Diagnosis not present

## 2018-03-04 DIAGNOSIS — Z87891 Personal history of nicotine dependence: Secondary | ICD-10-CM | POA: Diagnosis not present

## 2018-03-04 DIAGNOSIS — M170 Bilateral primary osteoarthritis of knee: Secondary | ICD-10-CM | POA: Diagnosis not present

## 2018-03-04 DIAGNOSIS — R54 Age-related physical debility: Secondary | ICD-10-CM | POA: Diagnosis not present

## 2018-03-04 DIAGNOSIS — E119 Type 2 diabetes mellitus without complications: Secondary | ICD-10-CM | POA: Diagnosis not present

## 2018-03-04 DIAGNOSIS — R2689 Other abnormalities of gait and mobility: Secondary | ICD-10-CM | POA: Diagnosis not present

## 2018-03-08 DIAGNOSIS — L821 Other seborrheic keratosis: Secondary | ICD-10-CM | POA: Diagnosis not present

## 2018-03-08 DIAGNOSIS — D485 Neoplasm of uncertain behavior of skin: Secondary | ICD-10-CM | POA: Diagnosis not present

## 2018-03-08 DIAGNOSIS — C4441 Basal cell carcinoma of skin of scalp and neck: Secondary | ICD-10-CM | POA: Diagnosis not present

## 2018-03-08 DIAGNOSIS — Z85828 Personal history of other malignant neoplasm of skin: Secondary | ICD-10-CM | POA: Diagnosis not present

## 2018-03-08 DIAGNOSIS — C44319 Basal cell carcinoma of skin of other parts of face: Secondary | ICD-10-CM | POA: Diagnosis not present

## 2018-03-09 DIAGNOSIS — R54 Age-related physical debility: Secondary | ICD-10-CM | POA: Diagnosis not present

## 2018-03-09 DIAGNOSIS — Z87891 Personal history of nicotine dependence: Secondary | ICD-10-CM | POA: Diagnosis not present

## 2018-03-09 DIAGNOSIS — M170 Bilateral primary osteoarthritis of knee: Secondary | ICD-10-CM | POA: Diagnosis not present

## 2018-03-09 DIAGNOSIS — E119 Type 2 diabetes mellitus without complications: Secondary | ICD-10-CM | POA: Diagnosis not present

## 2018-03-09 DIAGNOSIS — R2689 Other abnormalities of gait and mobility: Secondary | ICD-10-CM | POA: Diagnosis not present

## 2018-03-09 DIAGNOSIS — G8929 Other chronic pain: Secondary | ICD-10-CM | POA: Diagnosis not present

## 2018-03-12 DIAGNOSIS — G8929 Other chronic pain: Secondary | ICD-10-CM | POA: Diagnosis not present

## 2018-03-12 DIAGNOSIS — R2689 Other abnormalities of gait and mobility: Secondary | ICD-10-CM | POA: Diagnosis not present

## 2018-03-12 DIAGNOSIS — M170 Bilateral primary osteoarthritis of knee: Secondary | ICD-10-CM | POA: Diagnosis not present

## 2018-03-12 DIAGNOSIS — Z87891 Personal history of nicotine dependence: Secondary | ICD-10-CM | POA: Diagnosis not present

## 2018-03-12 DIAGNOSIS — R54 Age-related physical debility: Secondary | ICD-10-CM | POA: Diagnosis not present

## 2018-03-12 DIAGNOSIS — E119 Type 2 diabetes mellitus without complications: Secondary | ICD-10-CM | POA: Diagnosis not present

## 2018-03-16 DIAGNOSIS — G8929 Other chronic pain: Secondary | ICD-10-CM | POA: Diagnosis not present

## 2018-03-16 DIAGNOSIS — M170 Bilateral primary osteoarthritis of knee: Secondary | ICD-10-CM | POA: Diagnosis not present

## 2018-03-16 DIAGNOSIS — R2689 Other abnormalities of gait and mobility: Secondary | ICD-10-CM | POA: Diagnosis not present

## 2018-03-16 DIAGNOSIS — R54 Age-related physical debility: Secondary | ICD-10-CM | POA: Diagnosis not present

## 2018-03-16 DIAGNOSIS — E119 Type 2 diabetes mellitus without complications: Secondary | ICD-10-CM | POA: Diagnosis not present

## 2018-03-16 DIAGNOSIS — Z87891 Personal history of nicotine dependence: Secondary | ICD-10-CM | POA: Diagnosis not present

## 2018-03-17 DIAGNOSIS — R2689 Other abnormalities of gait and mobility: Secondary | ICD-10-CM | POA: Diagnosis not present

## 2018-03-17 DIAGNOSIS — M170 Bilateral primary osteoarthritis of knee: Secondary | ICD-10-CM | POA: Diagnosis not present

## 2018-03-17 DIAGNOSIS — R54 Age-related physical debility: Secondary | ICD-10-CM | POA: Diagnosis not present

## 2018-03-17 DIAGNOSIS — G8929 Other chronic pain: Secondary | ICD-10-CM | POA: Diagnosis not present

## 2018-03-17 DIAGNOSIS — E119 Type 2 diabetes mellitus without complications: Secondary | ICD-10-CM | POA: Diagnosis not present

## 2018-03-17 DIAGNOSIS — Z87891 Personal history of nicotine dependence: Secondary | ICD-10-CM | POA: Diagnosis not present

## 2018-03-23 DIAGNOSIS — R2689 Other abnormalities of gait and mobility: Secondary | ICD-10-CM | POA: Diagnosis not present

## 2018-03-23 DIAGNOSIS — M170 Bilateral primary osteoarthritis of knee: Secondary | ICD-10-CM | POA: Diagnosis not present

## 2018-03-23 DIAGNOSIS — Z87891 Personal history of nicotine dependence: Secondary | ICD-10-CM | POA: Diagnosis not present

## 2018-03-23 DIAGNOSIS — E119 Type 2 diabetes mellitus without complications: Secondary | ICD-10-CM | POA: Diagnosis not present

## 2018-03-23 DIAGNOSIS — R54 Age-related physical debility: Secondary | ICD-10-CM | POA: Diagnosis not present

## 2018-03-23 DIAGNOSIS — G8929 Other chronic pain: Secondary | ICD-10-CM | POA: Diagnosis not present

## 2018-03-24 ENCOUNTER — Encounter: Payer: Self-pay | Admitting: Sports Medicine

## 2018-03-24 ENCOUNTER — Ambulatory Visit (INDEPENDENT_AMBULATORY_CARE_PROVIDER_SITE_OTHER): Payer: Medicare Other | Admitting: Sports Medicine

## 2018-03-24 DIAGNOSIS — M170 Bilateral primary osteoarthritis of knee: Secondary | ICD-10-CM

## 2018-03-24 NOTE — Assessment & Plan Note (Signed)
Insufficient response with a series of Supartz, steroid injections at the last visit were not effective. Celebrex not effective. Return next week for PRP injection into the right knee. Declines discussion of knee arthroplasty. The last option would be to consider Coolief radio frequency ablation. I will see him next week for PRP.

## 2018-03-24 NOTE — Progress Notes (Signed)
Subjective:    CC: Follow-up  HPI: Darin Moran is a pleasant 84 year old male, significantly hard of hearing.  He has been to [facility], had a series of Supartz without much efficacy. We did a steroid injection bilateral at the last visit, only moderate but short-term relief. He has not yet done his Celebrex, he has been working hard in physical therapy.  He came in asking about stem cells, he is agreeable to try PRP.  Right knee is worse than the left, significant valgus deformity.  I reviewed the past medical history, family history, social history, surgical history, and allergies today and no changes were needed.  Please see the problem list section below in epic for further details.  Past Medical History: Past Medical History:  Diagnosis Date  . Arthritis   . Diabetes mellitus without complication [facility])    Past Surgical History: No past surgical history on file. Social History: Social History   Socioeconomic History  . Marital status: Married    Spouse name: Not on file  . Number of children: Not on file  . Years of education: Not on file  . Highest education level: Not on file  Occupational History  . Not on file  Social Needs  . Financial resource strain: Not on file  . Food insecurity:    Worry: Not on file    Inability: Not on file  . Transportation needs:    Medical: Not on file    Non-medical: Not on file  Tobacco Use  . Smoking status: Former Research scientist (life sciences)  . Smokeless tobacco: Never Used  Substance and Sexual Activity  . Alcohol use: Never    Frequency: Never  . Drug use: Never  . Sexual activity: Not Currently  Lifestyle  . Physical activity:    Days per week: Not on file    Minutes per session: Not on file  . Stress: Not on file  Relationships  . Social connections:    Talks on phone: Not on file    Gets together: Not on file    Attends religious service: Not on file    Active member of club or organization: Not on file    Attends meetings of clubs or  organizations: Not on file    Relationship status: Not on file  Other Topics Concern  . Not on file  Social History Narrative  . Not on file   Family History: Family History  Problem Relation Age of Onset  . Cancer Father        colon   Allergies: No Known Allergies Medications: See med rec.  Review of Systems: No fevers, chills, night sweats, weight loss, chest pain, or shortness of breath.   Objective:    General: Well Developed, well nourished, and in no acute distress.  Neuro: Alert and oriented x3, extra-ocular muscles intact, sensation grossly intact.  HEENT: Normocephalic, atraumatic, pupils equal round reactive to light, neck supple, no masses, no lymphadenopathy, thyroid nonpalpable.  Skin: Warm and dry, no rashes. Cardiac: Regular rate and rhythm, no murmurs rubs or gallops, no lower extremity edema.  Respiratory: Clear to auscultation bilaterally. Not using accessory muscles, speaking in full sentences.  Impression and Recommendations:    Primary osteoarthritis of both knees Insufficient response with a series of Supartz, steroid injections at the last visit were not effective. Celebrex not effective. Return next week for PRP injection into the right knee. Declines discussion of knee arthroplasty. The last option would be to consider Coolief radio frequency ablation. I will see  him next week for PRP.  I spent 25 minutes with this patient, greater than 50% was face-to-face time counseling regarding the above diagnoses, this was spent discussing the options for end-stage osteoarthritis and the limitations of PRP as well as stem cell injections. ___________________________________________ Gwen Her. Dianah Field, M.D., ABFM., CAQSM. Primary Care and [place] Instructor of [place] of [facility] of Medicine

## 2018-03-30 ENCOUNTER — Ambulatory Visit (INDEPENDENT_AMBULATORY_CARE_PROVIDER_SITE_OTHER): Payer: Medicare Other | Admitting: Sports Medicine

## 2018-03-30 ENCOUNTER — Encounter: Payer: Self-pay | Admitting: Sports Medicine

## 2018-03-30 DIAGNOSIS — M170 Bilateral primary osteoarthritis of knee: Secondary | ICD-10-CM

## 2018-03-30 NOTE — Assessment & Plan Note (Signed)
Did not respond to Supartz, steroid injections, Celebrex. Per patient request we did PRP injections into the right knee, we will do the left knee at the next visit if he has a success, if not he will need to consider Coolief radiofrequency ablation. Declines any discussion of arthroplasty.

## 2018-03-30 NOTE — Progress Notes (Signed)
   Procedure: Real-time Ultrasound Guided Platelet Rich Plasma (PRP) Injection of right knee Device: GE Logiq E  Verbal informed consent obtained.  Time-out conducted.  Noted no overlying erythema, induration, or other signs of local infection.  Obtained 30 cc of blood from peripheral vein, using the "PEAK" centrifuge, red blood cells were separated from the plasma. Subsequently red blood cells were drained leaving only plasma with the buffy coat layer between the desired lines. Platelet poor plasma was then centrifuged out, and remaining platelet rich plasma aspirated into a 5 cc syringe.  Skin prepped in a sterile fashion.  Local anesthesia: Topical Ethyl chloride.  With sterile technique and under real time ultrasound guidance the platelet rich plasma (PRP) obtained above: Using a 22-gauge needle I aspirated 30 cc of clear, straw-colored fluid, syringe switched and 2 cc lidocaine, 2 cc bupivacaine injected, syringe again switched and PRP injected into the right knee. Completed without difficulty  Advised to call if fevers/chills, erythema, induration, drainage, or persistent bleeding.  Images permanently stored and available for review in the ultrasound unit.  Impression: Technically successful ultrasound guided Platelet Rich Plasma (PRP) injection.  _____________________________________________________________________________________  Primary osteoarthritis of both knees Did not respond to Supartz, steroid injections, Celebrex. Per patient request we did PRP injections into the right knee, we will do the left knee at the next visit if he has a success, if not he will need to consider Coolief radiofrequency ablation. Declines any discussion of arthroplasty.

## 2018-03-31 DIAGNOSIS — H903 Sensorineural hearing loss, bilateral: Secondary | ICD-10-CM | POA: Diagnosis not present

## 2018-04-16 DIAGNOSIS — Z23 Encounter for immunization: Secondary | ICD-10-CM | POA: Diagnosis not present

## 2018-04-26 ENCOUNTER — Ambulatory Visit (INDEPENDENT_AMBULATORY_CARE_PROVIDER_SITE_OTHER): Payer: Medicare Other | Admitting: Sports Medicine

## 2018-04-26 ENCOUNTER — Encounter: Payer: Self-pay | Admitting: Sports Medicine

## 2018-04-26 DIAGNOSIS — M170 Bilateral primary osteoarthritis of knee: Secondary | ICD-10-CM

## 2018-04-26 DIAGNOSIS — M11269 Other chondrocalcinosis, unspecified knee: Secondary | ICD-10-CM

## 2018-04-26 NOTE — Assessment & Plan Note (Addendum)
Good response to PRP injection in the right knee a month ago, pain has improved as well as sensation of instability. In the past he did not respond to [facility], steroid injections, Celebrex. Today we did a PRP injection into the left knee. He will continue his rehabilitation exercises that he learned with physical therapy. If insufficient relief after that we will consider Coolief radiofrequency ablation. Consistently declines discussion of arthroplasty. Because fluid was slightly cloudy we are also adding a crystal analysis.

## 2018-04-26 NOTE — Progress Notes (Signed)
   Procedure: Real-time Ultrasound Guided Platelet Rich Plasma (PRP) Injection of left knee Device: GE Logiq E  Verbal informed consent obtained.  Time-out conducted.  Noted no overlying erythema, induration, or other signs of local infection.  Obtained 30 cc of blood from peripheral vein, using the "PEAK" centrifuge, red blood cells were separated from the plasma. Subsequently red blood cells were drained leaving only plasma with the buffy coat layer between the desired lines. Platelet poor plasma was then centrifuged out, and remaining platelet rich plasma aspirated into a 5 cc syringe.  Skin prepped in a sterile fashion.  Local anesthesia: Topical Ethyl chloride.  With sterile technique and under real time ultrasound guidance the platelet rich plasma (PRP) obtained above: Using an 18-gauge needle I aspirated 15 cc of slightly cloudy, straw-colored fluid, syringe switched and 2 cc lidocaine, 2 cc bupivacaine injected, syringe again switched and PRP injected into the right knee. Completed without difficulty  Advised to call if fevers/chills, erythema, induration, drainage, or persistent bleeding.  Images permanently stored and available for review in the ultrasound unit.  Impression: Technically successful ultrasound guided Platelet Rich Plasma (PRP) injection.

## 2018-04-27 DIAGNOSIS — M11269 Other chondrocalcinosis, unspecified knee: Secondary | ICD-10-CM | POA: Insufficient documentation

## 2018-04-27 LAB — SYNOVIAL FLUID, CRYSTAL

## 2018-04-27 NOTE — Assessment & Plan Note (Signed)
Considering cloudiness of fluid we ran a crystal analysis that showed calcium pyrophosphate crystals, this is consistent with pseudogout.  If pain comes back we will add colchicine daily.

## 2018-05-24 ENCOUNTER — Telehealth: Payer: Self-pay | Admitting: Sports Medicine

## 2018-05-24 ENCOUNTER — Ambulatory Visit (INDEPENDENT_AMBULATORY_CARE_PROVIDER_SITE_OTHER): Payer: Medicare Other | Admitting: Sports Medicine

## 2018-05-24 ENCOUNTER — Encounter: Payer: Self-pay | Admitting: Sports Medicine

## 2018-05-24 DIAGNOSIS — M11269 Other chondrocalcinosis, unspecified knee: Secondary | ICD-10-CM

## 2018-05-24 MED ORDER — COLCHICINE 0.6 MG PO TABS: 0.6000 mg | ORAL_TABLET | Freq: Every day | ORAL | 3 refills | Status: DC

## 2018-05-24 MED ORDER — MEDICAL COMPRESSION THIGH HIGH MISC: 11 refills | Status: DC

## 2018-05-24 MED ORDER — COLCHICINE 0.6 MG PO CAPS: 1.0000 | ORAL_CAPSULE | Freq: Every day | ORAL | 11 refills | Status: DC

## 2018-05-24 NOTE — Telephone Encounter (Signed)
No but I think they mean that his insurance company prefers the capsules over tablets.  I will switch it to capsules.

## 2018-05-24 NOTE — Progress Notes (Signed)
Subjective:    CC: Follow-up  HPI: This is a pleasant 84 year old male, we did bilateral PRP intra-articular injections per his request, his knees have done well, he did have an effusion which was aspirated, crystal analysis showed calcium pyrophosphate crystals.  He is overall doing well but does have some burning sensations in both knees.  He would also like thigh-high compression socks.  I reviewed the past medical history, family history, social history, surgical history, and allergies today and no changes were needed.  Please see the problem list section below in epic for further details.  Past Medical History: Past Medical History:  Diagnosis Date  . Arthritis   . Diabetes mellitus without complication [facility])    Past Surgical History: No past surgical history on file. Social History: Social History   Socioeconomic History  . Marital status: Married    Spouse name: Not on file  . Number of children: Not on file  . Years of education: Not on file  . Highest education level: Not on file  Occupational History  . Not on file  Social Needs  . Financial resource strain: Not on file  . Food insecurity:    Worry: Not on file    Inability: Not on file  . Transportation needs:    Medical: Not on file    Non-medical: Not on file  Tobacco Use  . Smoking status: Former Research scientist (life sciences)  . Smokeless tobacco: Never Used  Substance and Sexual Activity  . Alcohol use: Never    Frequency: Never  . Drug use: Never  . Sexual activity: Not Currently  Lifestyle  . Physical activity:    Days per week: Not on file    Minutes per session: Not on file  . Stress: Not on file  Relationships  . Social connections:    Talks on phone: Not on file    Gets together: Not on file    Attends religious service: Not on file    Active member of club or organization: Not on file    Attends meetings of clubs or organizations: Not on file    Relationship status: Not on file  Other Topics Concern  . Not  on file  Social History Narrative  . Not on file   Family History: Family History  Problem Relation Age of Onset  . Cancer Father        colon   Allergies: No Known Allergies Medications: See med rec.  Review of Systems: No fevers, chills, night sweats, weight loss, chest pain, or shortness of breath.   Objective:    General: Well Developed, well nourished, and in no acute distress.  Neuro: Alert and oriented x3, extra-ocular muscles intact, sensation grossly intact.  HEENT: Normocephalic, atraumatic, pupils equal round reactive to light, neck supple, no masses, no lymphadenopathy, thyroid nonpalpable.  Skin: Warm and dry, no rashes. Cardiac: Regular rate and rhythm, no murmurs rubs or gallops, no lower extremity edema.  Respiratory: Clear to auscultation bilaterally. Not using accessory muscles, speaking in full sentences.  Impression and Recommendations:    Pseudogout of knee Having a little bit of burning in both knees, adding colchicine. Return in 1 month. He would also like to extend his compression socks to thigh-high.  I spent 25 minutes with this patient, greater than 50% was face-to-face time counseling regarding the above diagnoses, specifically the difference between gout and pseudogout as well as our treatment strategy. ___________________________________________ Gwen Her. Dianah Field, M.D., ABFM., CAQSM. Primary Care and [place]  [facility]  Adjunct Professor of [place] of [facility] of Medicine

## 2018-05-24 NOTE — Telephone Encounter (Signed)
I received a fax from [facility] requesting that Colchecline tablets be changed to capsules since they are preferred. Did the patient prefer tablet over capsule? Please advise.

## 2018-05-24 NOTE — Patient Instructions (Addendum)
[employer]. [address], [place],  [address]  Calcium Pyrophosphate Deposition Calcium pyrophosphate deposition (CPPD), which is also called pseudogout, is a type of arthritis that causes pain, swelling, and inflammation in a joint. The joint pain can be severe and may last for days. If it is not treated, the pain may last much longer. Attacks of CPPD may come and go. This condition usually affects one joint at a time. The joints that are affected most commonly are the knees, but this condition can also affect the wrists, elbows, shoulders, or ankles. CPPD is similar to gout. Both conditions result from the buildup of crystals in the joint. However, CPPD is caused by a type of crystal that is different than the crystals that cause gout. What are the causes? This condition is caused by the buildup of calcium pyrophosphate dihydrate crystals in the joint. The reason why this buildup occurs is not known. The condition may be passed down from parent to child (hereditary). What increases the risk? This condition is more likely to develop in people who:  Are over 79 years old.  Have a family history of the condition.  Have had joint replacement surgery.  Have had a recent injury.  Have certain medical conditions, such as hemophilia, ochronosis, amyloidosis, or hormonal disorders.  Have low blood magnesium levels.  What are the signs or symptoms? Symptoms of this condition include:  Pain in a joint. The pain may: ? Be intense and constant. ? Come on quickly. ? Get worse with movement. ? Last from several days to a few weeks.  Redness, swelling, and warmth at the joint.  Stiffness of the joint.  How is this diagnosed? To diagnose this condition, your health care provider will use a needle to remove fluid from the joint. The fluid will be examined under a microscope to check for the crystals that cause CPPD. You may also have imaging tests, such  as:  X-rays.  Ultrasound.  How is this treated? There is no way to remove the crystals from the joint and no way to cure this condition. However, treatment can relieve symptoms and improve joint function. Treatment may include:  Nonsteroidal anti-inflammatory drugs (NSAIDs) to reduce inflammation and pain.  Medicines to help prevent attacks.  Injections of medicine (cortisone) into the joint to reduce pain and swelling.  Physical therapy to improve joint function.  Follow these instructions at home:  Take medicines only as directed by your health care provider.  Rest the affected joints until your symptoms start to go away.  Keep your affected joints raised (elevated) when possible. This will help to reduce swelling.  If directed, apply ice to the affected area: ? Put ice in a plastic bag. ? Place a towel between your skin and the bag. ? Leave the ice on for 20 minutes, 2-3 times per day.  If the painful joint is in your leg, use crutches as directed by your health care provider.  When your symptoms start to go away, begin to exercise regularly or do physical therapy. Talk with your health care provider or physical therapist about what types of exercise are safe for you. Low-impact exercise may be best. This includes walking, swimming, bicycling, and water aerobics.  Maintain a healthy weight so your joints do not need to bear more weight than necessary. Contact a health care provider if:  You have an increase in joint pain that is not relieved with medicine.  Your joint becomes more red, swollen, or  stiff.  You have a fever.  You have a skin rash. This information is not intended to replace advice given to you by your health care provider. Make sure you discuss any questions you have with your health care provider. Document Released: 03/22/2004 Document Revised: 12/06/2015 Document Reviewed: 06/07/2014 Elsevier Interactive Patient Education  [employer].

## 2018-05-24 NOTE — Assessment & Plan Note (Signed)
Having a little bit of burning in both knees, adding colchicine. Return in 1 month. He would also like to extend his compression socks to thigh-high.

## 2018-05-25 NOTE — Telephone Encounter (Signed)
Pt's wife advised.

## 2018-06-21 ENCOUNTER — Ambulatory Visit: Payer: Medicare Other | Admitting: Sports Medicine

## 2018-06-21 ENCOUNTER — Encounter: Payer: Self-pay | Admitting: Sports Medicine

## 2018-06-21 DIAGNOSIS — M7122 Synovial cyst of popliteal space [Baker], left knee: Secondary | ICD-10-CM | POA: Diagnosis not present

## 2018-06-21 NOTE — Progress Notes (Signed)
Subjective:    CC: Follow-up  HPI: Darin Moran returns, he is a pleasant 84 year old male, he has bilateral knee osteoarthritis, refuses surgical intervention.  Ultimately we did bilateral PRP intra-articular injections per his request and he improved considerably.  An aspiration did show calcium pyrophosphate crystals and he has done well with colchicine.  He is still working to get his bilateral thigh-high compression stockings.  He has noted increasing swelling in the back of his left knee.  I reviewed the past medical history, family history, social history, surgical history, and allergies today and no changes were needed.  Please see the problem list section below in epic for further details.  Past Medical History: Past Medical History:  Diagnosis Date  . Arthritis   . Diabetes mellitus without complication [facility])    Past Surgical History: No past surgical history on file. Social History: Social History   Socioeconomic History  . Marital status: Married    Spouse name: Not on file  . Number of children: Not on file  . Years of education: Not on file  . Highest education level: Not on file  Occupational History  . Not on file  Social Needs  . Financial resource strain: Not on file  . Food insecurity:    Worry: Not on file    Inability: Not on file  . Transportation needs:    Medical: Not on file    Non-medical: Not on file  Tobacco Use  . Smoking status: Former Research scientist (life sciences)  . Smokeless tobacco: Never Used  Substance and Sexual Activity  . Alcohol use: Never    Frequency: Never  . Drug use: Never  . Sexual activity: Not Currently  Lifestyle  . Physical activity:    Days per week: Not on file    Minutes per session: Not on file  . Stress: Not on file  Relationships  . Social connections:    Talks on phone: Not on file    Gets together: Not on file    Attends religious service: Not on file    Active member of club or organization: Not on file    Attends meetings of  clubs or organizations: Not on file    Relationship status: Not on file  Other Topics Concern  . Not on file  Social History Narrative  . Not on file   Family History: Family History  Problem Relation Age of Onset  . Cancer Father        colon   Allergies: No Known Allergies Medications: See med rec.  Review of Systems: No fevers, chills, night sweats, weight loss, chest pain, or shortness of breath.   Objective:    General: Well Developed, well nourished, and in no acute distress.  Neuro: Alert and oriented x3, extra-ocular muscles intact, sensation grossly intact.  HEENT: Normocephalic, atraumatic, pupils equal round reactive to light, neck supple, no masses, no lymphadenopathy, thyroid nonpalpable.  Skin: Warm and dry, no rashes. Cardiac: Regular rate and rhythm, no murmurs rubs or gallops, no lower extremity edema.  Respiratory: Clear to auscultation bilaterally. Not using accessory muscles, speaking in full sentences. Left knee: Normal to inspection with no erythema or effusion or obvious bony abnormalities. Palpable minimally tender large Baker's cyst ROM normal in flexion and extension and lower leg rotation. Ligaments with solid consistent endpoints including ACL, PCL, LCL, MCL. Negative Mcmurray's and provocative meniscal tests. Non painful patellar compression. Patellar and quadriceps tendons unremarkable. Hamstring and quadriceps strength is normal.  Procedure: Real-time Ultrasound Guided aspiration/injection of  left knee Baker's cyst Device: GE Logiq E  Verbal informed consent obtained.  Time-out conducted.  Noted no overlying erythema, induration, or other signs of local infection.  Skin prepped in a sterile fashion.  Local anesthesia: Topical Ethyl chloride.  With sterile technique and under real time ultrasound guidance: Using an 18-gauge needle aspirated 55 cc of clear, straw-colored fluid, syringe switched and 1 cc Kenalog 40, 1 cc lidocaine injected  easily Completed without difficulty  Pain immediately resolved suggesting accurate placement of the medication.  Advised to call if fevers/chills, erythema, induration, drainage, or persistent bleeding.  Images permanently stored and available for review in the ultrasound unit.  Impression: Technically successful ultrasound guided injection.  The knee was then strapped with a compressive dressing  Impression and Recommendations:    Baker's cyst, left 55cc aspiration and injection, return in 1 month. Strap with compressive dressing. ___________________________________________ Gwen Her. Dianah Field, M.D., ABFM., CAQSM. Primary Care and Sports Medicine [facility]  Adjunct Professor of [place] [employer] [facility] of Medicine

## 2018-06-21 NOTE — Assessment & Plan Note (Addendum)
55cc aspiration and injection, return in 1 month. Strap with compressive dressing.

## 2018-07-19 ENCOUNTER — Ambulatory Visit (INDEPENDENT_AMBULATORY_CARE_PROVIDER_SITE_OTHER): Payer: Medicare Other | Admitting: Sports Medicine

## 2018-07-19 ENCOUNTER — Encounter: Payer: Self-pay | Admitting: Sports Medicine

## 2018-07-19 DIAGNOSIS — M170 Bilateral primary osteoarthritis of knee: Secondary | ICD-10-CM

## 2018-07-19 MED ORDER — ACETAMINOPHEN ER 650 MG PO TBCR: 1300.0000 mg | EXTENDED_RELEASE_TABLET | Freq: Every day | ORAL | 3 refills | Status: DC

## 2018-07-19 NOTE — Assessment & Plan Note (Signed)
Overall doing well. He is post PRP injections into both knees. Really does not have any pain during the day. He does wake up at night with occasional knee pains. He will use arthritis strength Tylenol 2 tabs at bedtime, and also obtain a stationary bike. Declines any narcotic treatment. He is not interested in arthroplasty. Because he does have pseudogout if he develops a flare that looks like crystalline arthropathy we can do colchicine.

## 2018-07-19 NOTE — Progress Notes (Signed)
Subjective:    CC: Recheck knee  HPI: Darin Moran is a pleasant 85 year old male with bilateral knee osteoarthritis, we have done PRP injections into both of his knees, this was per his request, he understood the limitations of PRP intra-articular.  He surprisingly did very well, did develop a Baker's cyst on the left which was drained.  Overall he is pain-free with the exception of in the middle of the night, he will wake up with some aching in both knees, take some Tylenol and then go back to bed.  He never considered taking Tylenol before going to bed.  I reviewed the past medical history, family history, social history, surgical history, and allergies today and no changes were needed.  Please see the problem list section below in epic for further details.  Past Medical History: Past Medical History:  Diagnosis Date  . Arthritis   . Diabetes mellitus without complication [facility])    Past Surgical History: No past surgical history on file. Social History: Social History   Socioeconomic History  . Marital status: Married    Spouse name: Not on file  . Number of children: Not on file  . Years of education: Not on file  . Highest education level: Not on file  Occupational History  . Not on file  Social Needs  . Financial resource strain: Not on file  . Food insecurity:    Worry: Not on file    Inability: Not on file  . Transportation needs:    Medical: Not on file    Non-medical: Not on file  Tobacco Use  . Smoking status: Former Research scientist (life sciences)  . Smokeless tobacco: Never Used  Substance and Sexual Activity  . Alcohol use: Never    Frequency: Never  . Drug use: Never  . Sexual activity: Not Currently  Lifestyle  . Physical activity:    Days per week: Not on file    Minutes per session: Not on file  . Stress: Not on file  Relationships  . Social connections:    Talks on phone: Not on file    Gets together: Not on file    Attends religious service: Not on file    Active member of  club or organization: Not on file    Attends meetings of clubs or organizations: Not on file    Relationship status: Not on file  Other Topics Concern  . Not on file  Social History Narrative  . Not on file   Family History: Family History  Problem Relation Age of Onset  . Cancer Father        colon   Allergies: No Known Allergies Medications: See med rec.  Review of Systems: No fevers, chills, night sweats, weight loss, chest pain, or shortness of breath.   Objective:    General: Well Developed, well nourished, and in no acute distress.  Neuro: Alert and oriented x3, extra-ocular muscles intact, sensation grossly intact.  HEENT: Normocephalic, atraumatic, pupils equal round reactive to light, neck supple, no masses, no lymphadenopathy, thyroid nonpalpable.  Skin: Warm and dry, no rashes. Cardiac: Regular rate and rhythm, no murmurs rubs or gallops, no lower extremity edema.  Respiratory: Clear to auscultation bilaterally. Not using accessory muscles, speaking in full sentences.  Impression and Recommendations:    Primary osteoarthritis of both knees Overall doing well. He is post PRP injections into both knees. Really does not have any pain during the day. He does wake up at night with occasional knee pains. He will use  arthritis strength Tylenol 2 tabs at bedtime, and also obtain a stationary bike. Declines any narcotic treatment. He is not interested in arthroplasty. Because he does have pseudogout if he develops a flare that looks like crystalline arthropathy we can do colchicine. ___________________________________________ Gwen Her. Dianah Field, M.D., ABFM., CAQSM. Primary Care and Sports Medicine [facility]  Adjunct Professor of [place] [employer] [facility] of Medicine

## 2018-08-26 DIAGNOSIS — Z1389 Encounter for screening for other disorder: Secondary | ICD-10-CM | POA: Diagnosis not present

## 2018-08-26 DIAGNOSIS — H9190 Unspecified hearing loss, unspecified ear: Secondary | ICD-10-CM | POA: Diagnosis not present

## 2018-08-26 DIAGNOSIS — I10 Essential (primary) hypertension: Secondary | ICD-10-CM | POA: Diagnosis not present

## 2018-08-26 DIAGNOSIS — M11869 Other specified crystal arthropathies, unspecified knee: Secondary | ICD-10-CM | POA: Diagnosis not present

## 2018-08-26 DIAGNOSIS — E782 Mixed hyperlipidemia: Secondary | ICD-10-CM | POA: Diagnosis not present

## 2018-08-26 DIAGNOSIS — Z0000 Encounter for general adult medical examination without abnormal findings: Secondary | ICD-10-CM | POA: Diagnosis not present

## 2018-08-26 DIAGNOSIS — E1169 Type 2 diabetes mellitus with other specified complication: Secondary | ICD-10-CM | POA: Diagnosis not present

## 2018-08-26 DIAGNOSIS — D500 Iron deficiency anemia secondary to blood loss (chronic): Secondary | ICD-10-CM | POA: Diagnosis not present

## 2018-09-10 DIAGNOSIS — L570 Actinic keratosis: Secondary | ICD-10-CM | POA: Diagnosis not present

## 2018-09-10 DIAGNOSIS — L821 Other seborrheic keratosis: Secondary | ICD-10-CM | POA: Diagnosis not present

## 2018-09-10 DIAGNOSIS — Z85828 Personal history of other malignant neoplasm of skin: Secondary | ICD-10-CM | POA: Diagnosis not present

## 2018-09-10 DIAGNOSIS — D1801 Hemangioma of skin and subcutaneous tissue: Secondary | ICD-10-CM | POA: Diagnosis not present

## 2018-09-10 DIAGNOSIS — L853 Xerosis cutis: Secondary | ICD-10-CM | POA: Diagnosis not present

## 2018-09-20 DIAGNOSIS — J209 Acute bronchitis, unspecified: Secondary | ICD-10-CM | POA: Diagnosis not present

## 2018-09-20 DIAGNOSIS — R05 Cough: Secondary | ICD-10-CM | POA: Diagnosis not present

## 2018-11-24 ENCOUNTER — Ambulatory Visit (INDEPENDENT_AMBULATORY_CARE_PROVIDER_SITE_OTHER): Payer: Medicare Other

## 2018-11-24 ENCOUNTER — Other Ambulatory Visit: Payer: Self-pay

## 2018-11-24 ENCOUNTER — Ambulatory Visit (INDEPENDENT_AMBULATORY_CARE_PROVIDER_SITE_OTHER): Payer: Medicare Other | Admitting: Sports Medicine

## 2018-11-24 DIAGNOSIS — M11269 Other chondrocalcinosis, unspecified knee: Secondary | ICD-10-CM | POA: Diagnosis not present

## 2018-11-24 DIAGNOSIS — M25561 Pain in right knee: Secondary | ICD-10-CM | POA: Diagnosis not present

## 2018-11-24 MED ORDER — COLCHICINE 0.6 MG PO CAPS: ORAL_CAPSULE | ORAL | 3 refills | Status: DC

## 2018-11-24 NOTE — Assessment & Plan Note (Addendum)
Right knee aspiration and injection. 105 cc hemarthrosis. Hinged knee brace. X-rays. Restarting colchicine. Return in a month.

## 2018-11-24 NOTE — Progress Notes (Signed)
Subjective:    CC: Right knee swelling  HPI: This is a very pleasant 85 year old male, he has a history of pseudogout, he has bilateral osteoarthritis, we have treated him with bilateral PRP injections.  More recently he took his fall, he has severe pain and swelling in his right knee, no bruising.  Symptoms are severe, persistent, localized without radiation.  I reviewed the past medical history, family history, social history, surgical history, and allergies today and no changes were needed.  Please see the problem list section below in epic for further details.  Past Medical History: Past Medical History:  Diagnosis Date  . Arthritis   . Diabetes mellitus without complication [facility])    Past Surgical History: No past surgical history on file. Social History: Social History   Socioeconomic History  . Marital status: Married    Spouse name: Not on file  . Number of children: Not on file  . Years of education: Not on file  . Highest education level: Not on file  Occupational History  . Not on file  Social Needs  . Financial resource strain: Not on file  . Food insecurity:    Worry: Not on file    Inability: Not on file  . Transportation needs:    Medical: Not on file    Non-medical: Not on file  Tobacco Use  . Smoking status: Former Research scientist (life sciences)  . Smokeless tobacco: Never Used  Substance and Sexual Activity  . Alcohol use: Never    Frequency: Never  . Drug use: Never  . Sexual activity: Not Currently  Lifestyle  . Physical activity:    Days per week: Not on file    Minutes per session: Not on file  . Stress: Not on file  Relationships  . Social connections:    Talks on phone: Not on file    Gets together: Not on file    Attends religious service: Not on file    Active member of club or organization: Not on file    Attends meetings of clubs or organizations: Not on file    Relationship status: Not on file  Other Topics Concern  . Not on file  Social History  Narrative  . Not on file   Family History: Family History  Problem Relation Age of Onset  . Cancer Father        colon   Allergies: No Known Allergies Medications: See med rec.  Review of Systems: No fevers, chills, night sweats, weight loss, chest pain, or shortness of breath.   Objective:    General: Well Developed, well nourished, and in no acute distress.  Neuro: Alert and oriented x3, extra-ocular muscles intact, sensation grossly intact.  HEENT: Normocephalic, atraumatic, pupils equal round reactive to light, neck supple, no masses, no lymphadenopathy, thyroid nonpalpable.  Skin: Warm and dry, no rashes. Cardiac: Regular rate and rhythm, no murmurs rubs or gallops, no lower extremity edema.  Respiratory: Clear to auscultation bilaterally. Not using accessory muscles, speaking in full sentences. Right knee: Severely swollen, tense effusion. ROM normal in flexion and extension and lower leg rotation. LCL feels lax. Negative Mcmurray's and provocative meniscal tests. Non painful patellar compression. Patellar and quadriceps tendons unremarkable. Hamstring and quadriceps strength is normal.  Procedure: Real-time Ultrasound Guided aspiration/injection of the right knee Device: GE Logiq E  Verbal informed consent obtained.  Time-out conducted.  Noted no overlying erythema, induration, or other signs of local infection.  Skin prepped in a sterile fashion.  Local anesthesia: Topical  Ethyl chloride.  With sterile technique and under real time ultrasound guidance:  Using an 18-gauge needle I aspirated 105 cc of frank blood, syringe switched and 1 cc Kenalog 40, 2 cc lidocaine, 2 cc bupivacaine injected easily. Completed without difficulty  Pain immediately resolved suggesting accurate placement of the medication.  Advised to call if fevers/chills, erythema, induration, drainage, or persistent bleeding.  Images permanently stored and available for review in the ultrasound unit.   Impression: Technically successful ultrasound guided injection.  Impression and Recommendations:    Pseudogout of knee Right knee aspiration and injection. 105 cc hemarthrosis. Hinged knee brace. X-rays. Restarting colchicine. Return in a month.   ___________________________________________ Gwen Her. Dianah Field, M.D., ABFM., CAQSM. Primary Care and Sports Medicine [place] [facility]  Adjunct Professor of [place] of [facility] of Medicine

## 2018-11-25 MED ORDER — COLCHICINE 0.6 MG PO TABS: 0.6000 mg | ORAL_TABLET | Freq: Every day | ORAL | 3 refills | Status: DC

## 2018-11-25 NOTE — Addendum Note (Signed)
Addended by: Dessie Coma on: 11/25/2018 04:58 PM   Modules accepted: Orders

## 2018-12-23 ENCOUNTER — Ambulatory Visit: Payer: Medicare Other | Admitting: Sports Medicine

## 2018-12-24 DIAGNOSIS — H6092 Unspecified otitis externa, left ear: Secondary | ICD-10-CM | POA: Diagnosis not present

## 2018-12-28 ENCOUNTER — Encounter: Payer: Self-pay | Admitting: Sports Medicine

## 2018-12-28 ENCOUNTER — Other Ambulatory Visit: Payer: Self-pay

## 2018-12-28 ENCOUNTER — Ambulatory Visit (INDEPENDENT_AMBULATORY_CARE_PROVIDER_SITE_OTHER): Payer: Medicare Other | Admitting: Sports Medicine

## 2018-12-28 DIAGNOSIS — M11262 Other chondrocalcinosis, left knee: Secondary | ICD-10-CM | POA: Diagnosis not present

## 2018-12-28 NOTE — Assessment & Plan Note (Signed)
105 cc aspiration of hemarthrosis at the last visit, injected at the last visit. He returns today completely pain-free. He finishes colchicine. I am happy to refill this if he has another flare. Rehab exercises given, return as needed.

## 2018-12-28 NOTE — Progress Notes (Signed)
Subjective:    CC: Follow-up  HPI: Darin Moran had a fall a month ago, we aspirated a hemarthrosis and injected his right knee.  He returns today pain-free.  I reviewed the past medical history, family history, social history, surgical history, and allergies today and no changes were needed.  Please see the problem list section below in epic for further details.  Past Medical History: Past Medical History:  Diagnosis Date  . Arthritis   . Diabetes mellitus without complication [facility])    Past Surgical History: No past surgical history on file. Social History: Social History   Socioeconomic History  . Marital status: Married    Spouse name: Not on file  . Number of children: Not on file  . Years of education: Not on file  . Highest education level: Not on file  Occupational History  . Not on file  Social Needs  . Financial resource strain: Not on file  . Food insecurity    Worry: Not on file    Inability: Not on file  . Transportation needs    Medical: Not on file    Non-medical: Not on file  Tobacco Use  . Smoking status: Former Research scientist (life sciences)  . Smokeless tobacco: Never Used  Substance and Sexual Activity  . Alcohol use: Never    Frequency: Never  . Drug use: Never  . Sexual activity: Not Currently  Lifestyle  . Physical activity    Days per week: Not on file    Minutes per session: Not on file  . Stress: Not on file  Relationships  . Social Herbalist on phone: Not on file    Gets together: Not on file    Attends religious service: Not on file    Active member of club or organization: Not on file    Attends meetings of clubs or organizations: Not on file    Relationship status: Not on file  Other Topics Concern  . Not on file  Social History Narrative  . Not on file   Family History: Family History  Problem Relation Age of Onset  . Cancer Father        colon   Allergies: No Known Allergies Medications: See med rec.  Review of Systems: No fevers,  chills, night sweats, weight loss, chest pain, or shortness of breath.   Objective:    General: Well Developed, well nourished, and in no acute distress.  Neuro: Alert and oriented x3, extra-ocular muscles intact, sensation grossly intact.  HEENT: Normocephalic, atraumatic, pupils equal round reactive to light, neck supple, no masses, no lymphadenopathy, thyroid nonpalpable.  Skin: Warm and dry, no rashes. Cardiac: Regular rate and rhythm, no murmurs rubs or gallops, no lower extremity edema.  Respiratory: Clear to auscultation bilaterally. Not using accessory muscles, speaking in full sentences. Right knee: Still moderately swollen with a palpable effusion and a fluid wave but nontender. ROM normal in flexion and extension and lower leg rotation. Ligaments with solid consistent endpoints including ACL, PCL, LCL, MCL. Negative Mcmurray's and provocative meniscal tests. Non painful patellar compression. Patellar and quadriceps tendons unremarkable. Hamstring and quadriceps strength is normal.  Impression and Recommendations:    Pseudogout of knee 105 cc aspiration of hemarthrosis at the last visit, injected at the last visit. He returns today completely pain-free. He finishes colchicine. I am happy to refill this if he has another flare. Rehab exercises given, return as needed.   ___________________________________________ Gwen Her. Dianah Field, M.D., ABFM., CAQSM. Primary Care and Sports  Medicine [place] [facility]  Adjunct Professor of [place] of [facility] of Medicine

## 2019-02-28 DIAGNOSIS — M179 Osteoarthritis of knee, unspecified: Secondary | ICD-10-CM | POA: Diagnosis not present

## 2019-02-28 DIAGNOSIS — E1169 Type 2 diabetes mellitus with other specified complication: Secondary | ICD-10-CM | POA: Diagnosis not present

## 2019-02-28 DIAGNOSIS — I10 Essential (primary) hypertension: Secondary | ICD-10-CM | POA: Diagnosis not present

## 2019-02-28 DIAGNOSIS — R351 Nocturia: Secondary | ICD-10-CM | POA: Diagnosis not present

## 2019-03-09 DIAGNOSIS — E1169 Type 2 diabetes mellitus with other specified complication: Secondary | ICD-10-CM | POA: Diagnosis not present

## 2019-04-27 DIAGNOSIS — Z23 Encounter for immunization: Secondary | ICD-10-CM | POA: Diagnosis not present

## 2019-05-23 DIAGNOSIS — D485 Neoplasm of uncertain behavior of skin: Secondary | ICD-10-CM | POA: Diagnosis not present

## 2019-05-23 DIAGNOSIS — D1801 Hemangioma of skin and subcutaneous tissue: Secondary | ICD-10-CM | POA: Diagnosis not present

## 2019-05-23 DIAGNOSIS — L570 Actinic keratosis: Secondary | ICD-10-CM | POA: Diagnosis not present

## 2019-05-23 DIAGNOSIS — L821 Other seborrheic keratosis: Secondary | ICD-10-CM | POA: Diagnosis not present

## 2019-05-23 DIAGNOSIS — C4441 Basal cell carcinoma of skin of scalp and neck: Secondary | ICD-10-CM | POA: Diagnosis not present

## 2019-05-23 DIAGNOSIS — Z85828 Personal history of other malignant neoplasm of skin: Secondary | ICD-10-CM | POA: Diagnosis not present

## 2019-05-23 DIAGNOSIS — D692 Other nonthrombocytopenic purpura: Secondary | ICD-10-CM | POA: Diagnosis not present

## 2019-07-04 ENCOUNTER — Ambulatory Visit (INDEPENDENT_AMBULATORY_CARE_PROVIDER_SITE_OTHER): Payer: Medicare Other | Admitting: Sports Medicine

## 2019-07-04 ENCOUNTER — Other Ambulatory Visit: Payer: Self-pay

## 2019-07-04 ENCOUNTER — Encounter: Payer: Self-pay | Admitting: Sports Medicine

## 2019-07-04 DIAGNOSIS — M170 Bilateral primary osteoarthritis of knee: Secondary | ICD-10-CM

## 2019-07-04 MED ORDER — CELECOXIB 200 MG PO CAPS: ORAL_CAPSULE | ORAL | 2 refills | Status: DC

## 2019-07-04 MED ORDER — COLCHICINE 0.6 MG PO TABS: 0.6000 mg | ORAL_TABLET | Freq: Every day | ORAL | 3 refills | Status: DC

## 2019-07-04 NOTE — Assessment & Plan Note (Signed)
End-stage osteoarthritis with CPPD. Worsening swelling, restarting colchicine and Celebrex. This seemed to work well for many months. If he does not get sufficient relief after a full week of colchicine we will do an aspiration and injection, ultimately he does need arthroplasty.

## 2019-07-04 NOTE — Progress Notes (Signed)
Subjective:    CC: Bilateral knee pain  HPI: Courtlin returns, he is a very pleasant 85 year old male, he really needs knee replacements.  At the last visit we diagnosed him with pseudogout, he responded relatively well to colchicine and Celebrex.  He has run out and is having recurrence of discomfort.  Moderate, persistent, localized without radiation.  I reviewed the past medical history, family history, social history, surgical history, and allergies today and no changes were needed.  Please see the problem list section below in epic for further details.  Past Medical History: Past Medical History:  Diagnosis Date  . Arthritis   . Diabetes mellitus without complication [facility])    Past Surgical History: No past surgical history on file. Social History: Social History   Socioeconomic History  . Marital status: Married    Spouse name: Not on file  . Number of children: Not on file  . Years of education: Not on file  . Highest education level: Not on file  Occupational History  . Not on file  Tobacco Use  . Smoking status: Former Research scientist (life sciences)  . Smokeless tobacco: Never Used  Substance and Sexual Activity  . Alcohol use: Never  . Drug use: Never  . Sexual activity: Not Currently  Other Topics Concern  . Not on file  Social History Narrative  . Not on file   Social Determinants of Health   Financial Resource Strain:   . Difficulty of Paying Living Expenses: Not on file  Food Insecurity:   . Worried About Charity fundraiser in the Last Year: Not on file  . Ran Out of Food in the Last Year: Not on file  Transportation Needs:   . Lack of Transportation (Medical): Not on file  . Lack of Transportation (Non-Medical): Not on file  Physical Activity:   . Days of Exercise per Week: Not on file  . Minutes of Exercise per Session: Not on file  Stress:   . Feeling of Stress : Not on file  Social Connections:   . Frequency of Communication with Friends and Family: Not on file  .  Frequency of Social Gatherings with Friends and Family: Not on file  . Attends Religious Services: Not on file  . Active Member of Clubs or Organizations: Not on file  . Attends Archivist Meetings: Not on file  . Marital Status: Not on file   Family History: Family History  Problem Relation Age of Onset  . Cancer Father        colon   Allergies: No Known Allergies Medications: See med rec.  Review of Systems: No fevers, chills, night sweats, weight loss, chest pain, or shortness of breath.   Objective:    General: Well Developed, well nourished, and in no acute distress.  Neuro: Alert and oriented x3, extra-ocular muscles intact, sensation grossly intact.  HEENT: Normocephalic, atraumatic, pupils equal round reactive to light, neck supple, no masses, no lymphadenopathy, thyroid nonpalpable.  Skin: Warm and dry, no rashes. Cardiac: Regular rate and rhythm, no murmurs rubs or gallops, no lower extremity edema.  Respiratory: Clear to auscultation bilaterally. Not using accessory muscles, speaking in full sentences. Bilateral knees: Arthritic appearing knees, swollen on the right. ROM normal in flexion and extension and lower leg rotation. Ligaments with solid consistent endpoints including ACL, PCL, LCL, MCL. Negative Mcmurray's and provocative meniscal tests. Non painful patellar compression. Patellar and quadriceps tendons unremarkable. Hamstring and quadriceps strength is normal.  Impression and Recommendations:  Primary osteoarthritis of both knees End-stage osteoarthritis with CPPD. Worsening swelling, restarting colchicine and Celebrex. This seemed to work well for many months. If he does not get sufficient relief after a full week of colchicine we will do an aspiration and injection, ultimately he does need arthroplasty.    ___________________________________________ Gwen Her. Dianah Field, M.D., ABFM., CAQSM. Primary Care and Sports Medicine [facility]  Adjunct Professor of [place] of [facility] of Medicine

## 2019-07-13 DIAGNOSIS — M179 Osteoarthritis of knee, unspecified: Secondary | ICD-10-CM | POA: Diagnosis not present

## 2019-07-13 DIAGNOSIS — E782 Mixed hyperlipidemia: Secondary | ICD-10-CM | POA: Diagnosis not present

## 2019-07-13 DIAGNOSIS — E1169 Type 2 diabetes mellitus with other specified complication: Secondary | ICD-10-CM | POA: Diagnosis not present

## 2019-07-13 DIAGNOSIS — I10 Essential (primary) hypertension: Secondary | ICD-10-CM | POA: Diagnosis not present

## 2019-07-13 DIAGNOSIS — D500 Iron deficiency anemia secondary to blood loss (chronic): Secondary | ICD-10-CM | POA: Diagnosis not present

## 2019-08-11 DIAGNOSIS — M1711 Unilateral primary osteoarthritis, right knee: Secondary | ICD-10-CM | POA: Diagnosis not present

## 2019-08-11 DIAGNOSIS — M25562 Pain in left knee: Secondary | ICD-10-CM | POA: Diagnosis not present

## 2019-08-11 DIAGNOSIS — M1712 Unilateral primary osteoarthritis, left knee: Secondary | ICD-10-CM | POA: Diagnosis not present

## 2019-08-11 DIAGNOSIS — M25561 Pain in right knee: Secondary | ICD-10-CM | POA: Diagnosis not present

## 2019-08-23 ENCOUNTER — Telehealth: Payer: Self-pay | Admitting: *Deleted

## 2019-08-23 NOTE — Telephone Encounter (Signed)
Pt's son called today stating that [facility] sent them a letter that we need to do an exception letter or something like that for his Colchicine.  Right now his out of pocket for this is $200 for 20 pills.  [facility] Pall can you look into this for the pt??

## 2019-08-24 NOTE — Telephone Encounter (Signed)
I called [facility] and the patient has a high deductible. Can you see if insurance will need a PA? Please check into this.

## 2019-09-01 ENCOUNTER — Other Ambulatory Visit: Payer: Self-pay | Admitting: Sports Medicine

## 2019-09-01 DIAGNOSIS — M11262 Other chondrocalcinosis, left knee: Secondary | ICD-10-CM

## 2019-09-01 MED ORDER — COLCHICINE 0.6 MG PO CAPS: 1.0000 | ORAL_CAPSULE | Freq: Every day | ORAL | 3 refills | Status: DC

## 2019-09-07 DIAGNOSIS — Z23 Encounter for immunization: Secondary | ICD-10-CM | POA: Diagnosis not present

## 2019-09-12 DIAGNOSIS — M11869 Other specified crystal arthropathies, unspecified knee: Secondary | ICD-10-CM | POA: Diagnosis not present

## 2019-09-12 DIAGNOSIS — D500 Iron deficiency anemia secondary to blood loss (chronic): Secondary | ICD-10-CM | POA: Diagnosis not present

## 2019-09-12 DIAGNOSIS — Z0000 Encounter for general adult medical examination without abnormal findings: Secondary | ICD-10-CM | POA: Diagnosis not present

## 2019-09-12 DIAGNOSIS — H9190 Unspecified hearing loss, unspecified ear: Secondary | ICD-10-CM | POA: Diagnosis not present

## 2019-09-12 DIAGNOSIS — I10 Essential (primary) hypertension: Secondary | ICD-10-CM | POA: Diagnosis not present

## 2019-09-12 DIAGNOSIS — E1169 Type 2 diabetes mellitus with other specified complication: Secondary | ICD-10-CM | POA: Diagnosis not present

## 2019-09-12 DIAGNOSIS — E782 Mixed hyperlipidemia: Secondary | ICD-10-CM | POA: Diagnosis not present

## 2019-09-12 DIAGNOSIS — M179 Osteoarthritis of knee, unspecified: Secondary | ICD-10-CM | POA: Diagnosis not present

## 2019-09-12 DIAGNOSIS — N183 Chronic kidney disease, stage 3 unspecified: Secondary | ICD-10-CM | POA: Diagnosis not present

## 2019-09-28 NOTE — Telephone Encounter (Signed)
I spoke with Patient's wife she stated the medication was lowered and if they have an issue with price they will call me so I can do a tier exception but for now she is only paying 50 for it and she is ok with that. - CF

## 2019-10-05 DIAGNOSIS — Z23 Encounter for immunization: Secondary | ICD-10-CM | POA: Diagnosis not present

## 2019-10-10 ENCOUNTER — Other Ambulatory Visit: Payer: Self-pay | Admitting: Sports Medicine

## 2019-10-10 DIAGNOSIS — D500 Iron deficiency anemia secondary to blood loss (chronic): Secondary | ICD-10-CM | POA: Diagnosis not present

## 2019-10-10 DIAGNOSIS — E1169 Type 2 diabetes mellitus with other specified complication: Secondary | ICD-10-CM | POA: Diagnosis not present

## 2019-10-10 DIAGNOSIS — E782 Mixed hyperlipidemia: Secondary | ICD-10-CM | POA: Diagnosis not present

## 2019-10-10 DIAGNOSIS — M179 Osteoarthritis of knee, unspecified: Secondary | ICD-10-CM | POA: Diagnosis not present

## 2019-10-10 DIAGNOSIS — N183 Chronic kidney disease, stage 3 unspecified: Secondary | ICD-10-CM | POA: Diagnosis not present

## 2019-10-10 DIAGNOSIS — M11262 Other chondrocalcinosis, left knee: Secondary | ICD-10-CM

## 2019-10-10 DIAGNOSIS — I10 Essential (primary) hypertension: Secondary | ICD-10-CM | POA: Diagnosis not present

## 2019-10-10 MED ORDER — COLCHICINE 0.6 MG PO CAPS: 1.0000 | ORAL_CAPSULE | Freq: Every day | ORAL | 3 refills | Status: DC

## 2019-10-17 DIAGNOSIS — H02834 Dermatochalasis of left upper eyelid: Secondary | ICD-10-CM | POA: Diagnosis not present

## 2019-10-17 DIAGNOSIS — H02831 Dermatochalasis of right upper eyelid: Secondary | ICD-10-CM | POA: Diagnosis not present

## 2019-10-17 DIAGNOSIS — H02132 Senile ectropion of right lower eyelid: Secondary | ICD-10-CM | POA: Diagnosis not present

## 2019-10-17 DIAGNOSIS — H25813 Combined forms of age-related cataract, bilateral: Secondary | ICD-10-CM | POA: Diagnosis not present

## 2019-11-01 DIAGNOSIS — L821 Other seborrheic keratosis: Secondary | ICD-10-CM | POA: Diagnosis not present

## 2019-11-01 DIAGNOSIS — L812 Freckles: Secondary | ICD-10-CM | POA: Diagnosis not present

## 2019-11-01 DIAGNOSIS — Z85828 Personal history of other malignant neoplasm of skin: Secondary | ICD-10-CM | POA: Diagnosis not present

## 2019-11-01 DIAGNOSIS — L570 Actinic keratosis: Secondary | ICD-10-CM | POA: Diagnosis not present

## 2019-11-10 NOTE — H&P (Signed)
Surgical History & Physical  Patient Name: Darin Moran DOB: 1926/07/23  Surgery: Cataract extraction with intraocular lens implant phacoemulsification; Left Eye  Surgeon: Baruch Goldmann MD Surgery Date:  11/21/2019 Pre-Op Date:  11/10/2019  HPI: A 53 Yr. old male patient -referred by OD (Dr. Marin Comment) in [place] 1. 1. The patient complains of difficulty when viewing TV, reading closed caption, news scrolls on TV, which began 1 years ago. Both eyes are affected. The episode is constant and gradual. The patient describes foggy and hazy symptoms affecting their eyes/vision. Symptoms occur when the patient is driving and reading. The condition is better in dim light. Patient uses separate reading glasses for increased reading VA when needed. Patient failed VA screening last year at [facility] but got glasses that seemed to help but not for any length of time. VA continues to deteriorate. Ready to consider surgery for BCVA. This is negatively affecting the patient's quality of life. OD constant tearing x years. Uses OTC tears with minimal relief. Patient and wife have noticed increased tearing and discharge OD x 1 year. BUL and BLL are swelling more and drooping. OD gets red at times. HPI Completed by Dr. Baruch Goldmann   Medical History: Cataracts Arthritis Diabetes High Blood Pressure LDL extreme HOH pseudoGout-resolved  Review of Systems Ear, Nose, Mouth & Throat Deafness Musculoskeletal Joint Ache, Stiffness All recorded systems are negative except as noted above.  Social   Former smoker   Medication Icaps,  Pravastatin, Hydrochlorothiazide, Pioglitazone HCL, Quinapril, Metformin, Multivitamin, Vitamin D3,   Sx/Procedures  None  Drug Allergies   NKDA  History & Physical: Heent:  Cataract, Left eye NECK: supple without bruits LUNGS: lungs clear to auscultation CV: regular rate and rhythm Abdomen: soft and non-tender  Impression & Plan: Assessment: 1.  COMBINED FORMS AGE  RELATED CATARACT; Both Eyes (H25.813) 2.  DERMATOCHALASIS, no surgery; Right Upper Lid, Left Upper Lid (H02.831, H02.834) 3.  ECTROPION SENILE; Right Lower Lid (H02.132) 4.  NUCLEAR SCLEROSIS AGE RELATED; Both Eyes (H25.13)  Plan: 1.  Cataract accounts for the patient's decreased vision. This visual impairment is not correctable with a tolerable change in glasses or contact lenses. Cataract surgery with an implantation of a new lens should significantly improve the visual and functional status of the patient. Discussed all risks, benefits, alternatives, and potential complications. Discussed the procedures and recovery. Patient desires to have surgery. A-scan ordered and performed today for intra-ocular lens calculations. The surgery will be performed in order to improve vision for driving, reading, and for eye examinations. Recommend phacoemulsification with intra-ocular lens. Left Eye worse - first. Dilates poorly - shugacaine by protocol. Omidria. Malyugin in room. 2.  will address after cataract surgery. 3.  causing tearing. Will address after cataract surgery,.

## 2019-11-14 DIAGNOSIS — H25812 Combined forms of age-related cataract, left eye: Secondary | ICD-10-CM | POA: Diagnosis not present

## 2019-11-18 ENCOUNTER — Encounter (HOSPITAL_COMMUNITY)
Admission: RE | Admit: 2019-11-18 | Discharge: 2019-11-18 | Disposition: A | Discharge status: Home / Self Care | Payer: Medicare Other | Source: Ambulatory Visit | Attending: Ophthalmology | Admitting: Ophthalmology

## 2019-11-18 ENCOUNTER — Encounter (HOSPITAL_COMMUNITY): Payer: Self-pay

## 2019-11-18 ENCOUNTER — Other Ambulatory Visit: Payer: Self-pay

## 2019-11-18 ENCOUNTER — Other Ambulatory Visit (HOSPITAL_COMMUNITY)
Admission: RE | Admit: 2019-11-18 | Discharge: 2019-11-18 | Disposition: A | Discharge status: Home / Self Care | Payer: Medicare Other | Source: Ambulatory Visit | Attending: Ophthalmology | Admitting: Ophthalmology

## 2019-11-18 DIAGNOSIS — Z01812 Encounter for preprocedural laboratory examination: Secondary | ICD-10-CM | POA: Diagnosis not present

## 2019-11-18 DIAGNOSIS — Z20822 Contact with and (suspected) exposure to covid-19: Secondary | ICD-10-CM | POA: Insufficient documentation

## 2019-11-18 HISTORY — DX: Essential (primary) hypertension: I10

## 2019-11-18 LAB — BASIC METABOLIC PANEL
Anion gap: 12 (ref 5–15)
BUN: 38 mg/dL — ABNORMAL HIGH (ref 8–23)
CO2: 26 mmol/L (ref 22–32)
Calcium: 9.8 mg/dL (ref 8.9–10.3)
Chloride: 100 mmol/L (ref 98–111)
Creatinine, Ser: 1.25 mg/dL — ABNORMAL HIGH (ref 0.61–1.24)
GFR calc Af Amer: 58 mL/min — ABNORMAL LOW (ref >60)
GFR calc non Af Amer: 50 mL/min — ABNORMAL LOW (ref >60)
Glucose, Bld: 271 mg/dL — ABNORMAL HIGH (ref 70–99)
Potassium: 5 mmol/L (ref 3.5–5.1)
Sodium: 138 mmol/L (ref 135–145)

## 2019-11-18 NOTE — Patient Instructions (Signed)
atar Cataract Surgery, Care After This sheet gives you information about how to care for yourself after your procedure. Your health care provider may also give you more specific instructions. If you have problems or questions, contact your health care provider. What can I expect after the procedure? After the procedure, it is common to have:  Itching.  Discomfort.  Fluid discharge.  Sensitivity to light and to touch.  Bruising in or around the eye.  Mild blurred vision. Follow these instructions at home: Eye care   Do not touch or rub your eyes.  Protect your eyes as told by your health care provider. You may be told to wear a protective eye shield or sunglasses.  Do not put a contact lens into the affected eye or eyes until your health care provider approves.  Keep the area around your eye clean and dry: ? Avoid swimming. ? Do not allow water to hit you directly in the face while showering. ? Keep soap and shampoo out of your eyes.  Check your eye every day for signs of infection. Watch for: ? Redness, swelling, or pain. ? Fluid, blood, or pus. ? Warmth. ? A bad smell. ? Vision that is getting worse. ? Sensitivity that is getting worse. Activity  Do not drive for 24 hours if you were given a sedative during your procedure.  Avoid strenuous activities, such as playing contact sports, for as long as told by your health care provider.  Do not drive or use heavy machinery until your health care provider approves.  Do not bend or lift heavy objects. Bending increases pressure in the eye. You can walk, climb stairs, and do light household chores.  Ask your health care provider when you can return to work. If you work in a dusty environment, you may be advised to wear protective eyewear for a period of time. General instructions  Take or apply over-the-counter and prescription medicines only as told by your health care provider. This includes eye drops.  Keep all  follow-up visits as told by your health care provider. This is important. Contact a health care provider if:  You have increased bruising around your eye.  You have pain that is not helped with medicine.  You have a fever.  You have redness, swelling, or pain in your eye.  You have fluid, blood, or pus coming from your incision.  Your vision gets worse.  Your sensitivity to light gets worse. Get help right away if:  You have sudden loss of vision.  You see flashes of light or spots (floaters).  You have severe eye pain.  You develop nausea or vomiting. Summary  After your procedure, it is common to have itching, discomfort, bruising, fluid discharge, or sensitivity to light.  Follow instructions from your health care provider about caring for your eye after the procedure.  Do not rub your eye after the procedure. You may need to wear eye protection or sunglasses. Do not wear contact lenses. Keep the area around your eye clean and dry.  Avoid activities that require a lot of effort. These include playing sports and lifting heavy objects.  Contact a health care provider if you have increased bruising, pain that does not go away, or a fever. Get help right away if you suddenly lose your vision, see flashes of light or spots, or have severe pain in the eye. This information is not intended to replace advice given to you by your health care provider. Make sure you  discuss any questions you have with your health care provider. Document Revised: 04/26/2019 Document Reviewed: 12/28/2017 Elsevier Patient Education  [place]  A cataract is a buildup of protein that causes the lens of your eye to become cloudy. The lens is normally clear. It is the part of the eye that is behind your iris and pupil. The lens focuses light on the retina, which lets you see clearly. When a lens becomes cloudy, your vision may become blurry. The clouding can range from a tiny dot to  complete cloudiness. As some cataracts develop, they can make it harder for you to see things that are far away. (You become more nearsighted.) Other cataracts increase glare. Cataracts can worsen over time, and sometimes the pupil can look white. As cataracts get worse, they cloud more of the lens, making it difficult to see. Cataracts can affect one eye or both eyes. What are the causes? This condition may be caused by age-related eye changes. The lens of the eye is mostly made up of water and protein. Normally, this protein is arranged in a way that keeps the lens clear. Cataracts develop when protein begins to clump together over time. This buildup of protein clouds the lens and lets less light pass through to the retina, which causes blurry vision. What increases the risk? You are more likely to develop this condition if you:  Are 21 years of age or older.  Have diabetes.  Have high blood pressure.  Take certain medicines, such as steroids or hormone replacement therapy.  Have had an eye injury.  Have or have had eye inflammation.  Have a family history of cataracts.  Smoke.  Drink alcohol heavily.  Are frequently exposed to sun or very strong light without eye protection.  Are obese.  Have been exposed to large amounts of radiation, lead, or other toxic substances.  Have had eye surgery. What are the signs or symptoms? The main symptom of a cataract is blurry vision. Your vision may change or get worse over time. Other symptoms include:  Increased glare.  Seeing a bright ring or halo around light.  Poor night vision.  Double or "shadow" vision in one eye or both eyes.  Having trouble seeing, even while wearing contact lenses or glasses.  Seeing colors that appear faded.  Having trouble telling the difference between blue and purple.  Needing frequent changes to your prescription glasses or contacts. How is this diagnosed? This condition is diagnosed with a  medical history and eye exam.  You should see an eye specialist (optometrist or ophthalmologist).  Your health care provider may enlarge (dilate) your pupils with eye drops to see the back of your eye more clearly and look for signs of cataracts or other eye damage. You may also have tests, including:  A visual acuity test. This uses a chart to determine the smallest letters that you can see from a specific distance.  A slit-lamp exam. This uses a microscope to examine small sections of your eye for abnormalities.  Tonometry. This test measures the pressure of the fluid inside your eye.  Glare testing. This test shines a light in your eye while you view letters to see whether the bright light affects your vision. How is this treated? Treatment depends on the stage of your cataract. You may:  Wear eyeglasses or use stronger light. This is for an early-stage cataract.  Have surgery if the condition is severely affecting your vision. This is needed for  late-stage cataract.  Stop or change certain medicines. This is recommended if your health care provider thinks your cataract may be linked to your medicines. Follow these instructions at home: Lifestyle  Use stronger or brighter lighting.  Consider using a magnifying glass for reading or other activities.  Become familiar with your surroundings. Having poor vision can put you at greater risk for tripping, falling, or bumping into things.  Wear sunglasses and a hat if you are sensitive to bright light or are having problems with glare.  Do not use any products that contain nicotine or tobacco, such as cigarettes, e-cigarettes, and chewing tobacco. If you need help quitting, ask your health care provider. General instructions  If you are prescribed new eyeglasses, wear them as told by your health care provider.  Take over-the-counter and prescription medicines only as told by your health care provider. Do not change your medicines  unless told by your health care provider.  Do not drive or use heavy machinery if your vision is blurry, particularly at night.  Keep your blood sugar under control if you have diabetes.  Keep all follow-up visits as told by your health care provider. This is important. Contact a health care provider if:  Your symptoms get worse.  Your vision affects your ability to perform daily activities.  You have new symptoms.  You have a fever. Get help right away if:  You have sudden vision loss.  You have redness, swelling, or increasing pain in your eye.  You develop a headache and sensitivity to light. Summary  A cataract is a buildup of protein that causes the lens of your eye to become cloudy. Cataracts are very common, especially as people age.  Mild cataracts cause mild visual symptoms, while more severe cataracts can cause a significant decrease in quality of life.  Mild cataracts can often be treated with a prescription for new glasses or contact lenses, while surgery is often recommended for more severe cataracts.  Contact a health care provider if your symptoms get worse, your vision affects your ability to do daily activities, or you have a fever.  Get help right away if you have sudden vision loss, redness, swelling, or increasing pain in the eye, or you develop a headache or sensitivity to light. This information is not intended to replace advice given to you by your health care provider. Make sure you discuss any questions you have with your health care provider. Document Revised: 12/28/2017 Document Reviewed: 12/28/2017 Elsevier Patient Education  2020 [place]  11/18/2019     @[email or web address] @   Your procedure is scheduled on Monday, May 10.  Report to [facility] at 1035 A.M.  Call this number if you have problems the morning of surgery:  [phone]   Remember:  Do not eat or drink after midnight.      Take these medicines the  morning of surgery with A SIP OF WATER flomax    Do not wear jewelry, make-up or nail polish.  Do not wear lotions, powders, or perfumes, or deodorant.  Do not shave 48 hours prior to surgery.  Men may shave face and neck.  Do not bring valuables to the hospital.  [facility] is not responsible for any belongings or valuables.  Contacts, dentures or bridgework may not be worn into surgery.  Leave your suitcase in the car.  After surgery it may be brought to your room.  For patients admitted to the hospital, discharge time  will be determined by your treatment team.  Patients discharged the day of surgery will not be allowed to drive home.   Name and phone number of your driver:   family Special instructions:  none  Please read over the following fact sheets that you were given. Anesthesia Post-op Instructions and Care and Recovery After Surgery       Monitored Anesthesia Care Anesthesia is a term that refers to techniques, procedures, and medicines that help a person stay safe and comfortable during a medical procedure. Monitored anesthesia care, or sedation, is one type of anesthesia. Your anesthesia specialist may recommend sedation if you will be having a procedure that does not require you to be unconscious, such as:  Cataract surgery.  A dental procedure.  A biopsy.  A colonoscopy. During the procedure, you may receive a medicine to help you relax (sedative). There are three levels of sedation:  Mild sedation. At this level, you may feel awake and relaxed. You will be able to follow directions.  Moderate sedation. At this level, you will be sleepy. You may not remember the procedure.  Deep sedation. At this level, you will be asleep. You will not remember the procedure. The more medicine you are given, the deeper your level of sedation will be. Depending on how you respond to the procedure, the anesthesia specialist may change your level of sedation or the type of  anesthesia to fit your needs. An anesthesia specialist will monitor you closely during the procedure. Let your health care provider know about:  Any allergies you have.  All medicines you are taking, including vitamins, herbs, eye drops, creams, and over-the-counter medicines.  Any use of steroids (by mouth or as a cream).  Any problems you or family members have had with sedatives and anesthetic medicines.  Any blood disorders you have.  Any surgeries you have had.  Any medical conditions you have, such as sleep apnea.  Whether you are pregnant or may be pregnant.  Any use of cigarettes, alcohol, or street drugs. What are the risks? Generally, this is a safe procedure. However, problems may occur, including:  Getting too much medicine (oversedation).  Nausea.  Allergic reaction to medicines.  Trouble breathing. If this happens, a breathing tube may be used to help with breathing. It will be removed when you are awake and breathing on your own.  Heart trouble.  Lung trouble. Before the procedure Staying hydrated Follow instructions from your health care provider about hydration, which may include:  Up to 2 hours before the procedure - you may continue to drink clear liquids, such as water, clear fruit juice, black coffee, and plain tea. Eating and drinking restrictions Follow instructions from your health care provider about eating and drinking, which may include:  8 hours before the procedure - stop eating heavy meals or foods such as meat, fried foods, or fatty foods.  6 hours before the procedure - stop eating light meals or foods, such as toast or cereal.  6 hours before the procedure - stop drinking milk or drinks that contain milk.  2 hours before the procedure - stop drinking clear liquids. Medicines Ask your health care provider about:  Changing or stopping your regular medicines. This is especially important if you are taking diabetes medicines or blood  thinners.  Taking medicines such as aspirin and ibuprofen. These medicines can thin your blood. Do not take these medicines before your procedure if your health care provider instructs you not to. Tests and  exams  You will have a physical exam.  You may have blood tests done to show: ? How well your kidneys and liver are working. ? How well your blood can clot. General instructions  Plan to have someone take you home from the hospital or clinic.  If you will be going home right after the procedure, plan to have someone with you for 24 hours.  What happens during the procedure?  Your blood pressure, heart rate, breathing, level of pain and overall condition will be monitored.  An IV tube will be inserted into one of your veins.  Your anesthesia specialist will give you medicines as needed to keep you comfortable during the procedure. This may mean changing the level of sedation.  The procedure will be performed. After the procedure  Your blood pressure, heart rate, breathing rate, and blood oxygen level will be monitored until the medicines you were given have worn off.  Do not drive for 24 hours if you received a sedative.  You may: ? Feel sleepy, clumsy, or nauseous. ? Feel forgetful about what happened after the procedure. ? Have a sore throat if you had a breathing tube during the procedure. ? Vomit. This information is not intended to replace advice given to you by your health care provider. Make sure you discuss any questions you have with your health care provider. Document Revised: 06/12/2017 Document Reviewed: 10/21/2015 Elsevier Patient Education  [place].

## 2019-11-19 LAB — SARS CORONAVIRUS 2 (TAT 6-24 HRS): SARS Coronavirus 2: NEGATIVE

## 2019-11-21 ENCOUNTER — Encounter (HOSPITAL_COMMUNITY)
Admission: RE | Disposition: A | Discharge status: Home / Self Care | Payer: Self-pay | Source: Home / Self Care | Attending: Ophthalmology

## 2019-11-21 ENCOUNTER — Ambulatory Visit (HOSPITAL_COMMUNITY): Payer: Medicare Other | Admitting: Anesthesiology

## 2019-11-21 ENCOUNTER — Ambulatory Visit (HOSPITAL_COMMUNITY)
Admission: RE | Admit: 2019-11-21 | Discharge: 2019-11-21 | Disposition: A | Discharge status: Home / Self Care | Payer: Medicare Other | Attending: Ophthalmology | Admitting: Ophthalmology

## 2019-11-21 DIAGNOSIS — H02831 Dermatochalasis of right upper eyelid: Secondary | ICD-10-CM | POA: Insufficient documentation

## 2019-11-21 DIAGNOSIS — E1136 Type 2 diabetes mellitus with diabetic cataract: Secondary | ICD-10-CM | POA: Diagnosis not present

## 2019-11-21 DIAGNOSIS — H02132 Senile ectropion of right lower eyelid: Secondary | ICD-10-CM | POA: Insufficient documentation

## 2019-11-21 DIAGNOSIS — H9190 Unspecified hearing loss, unspecified ear: Secondary | ICD-10-CM | POA: Insufficient documentation

## 2019-11-21 DIAGNOSIS — H2181 Floppy iris syndrome: Secondary | ICD-10-CM | POA: Insufficient documentation

## 2019-11-21 DIAGNOSIS — H25812 Combined forms of age-related cataract, left eye: Secondary | ICD-10-CM | POA: Diagnosis not present

## 2019-11-21 DIAGNOSIS — H25813 Combined forms of age-related cataract, bilateral: Secondary | ICD-10-CM | POA: Insufficient documentation

## 2019-11-21 DIAGNOSIS — Z87891 Personal history of nicotine dependence: Secondary | ICD-10-CM | POA: Insufficient documentation

## 2019-11-21 DIAGNOSIS — I10 Essential (primary) hypertension: Secondary | ICD-10-CM | POA: Diagnosis not present

## 2019-11-21 DIAGNOSIS — Z79899 Other long term (current) drug therapy: Secondary | ICD-10-CM | POA: Diagnosis not present

## 2019-11-21 DIAGNOSIS — E119 Type 2 diabetes mellitus without complications: Secondary | ICD-10-CM | POA: Diagnosis not present

## 2019-11-21 DIAGNOSIS — Z7984 Long term (current) use of oral hypoglycemic drugs: Secondary | ICD-10-CM | POA: Insufficient documentation

## 2019-11-21 HISTORY — PX: CATARACT EXTRACTION W/PHACO: SHX586

## 2019-11-21 LAB — GLUCOSE, CAPILLARY: Glucose-Capillary: 153 mg/dL — ABNORMAL HIGH (ref 70–99)

## 2019-11-21 SURGERY — PHACOEMULSIFICATION, CATARACT, WITH IOL INSERTION
Anesthesia: Monitor Anesthesia Care | Site: Eye | Laterality: Left

## 2019-11-21 MED ORDER — DEXAMETHASONE 0.4 MG OP INST
VAGINAL_INSERT | OPHTHALMIC | Status: DC | PRN
  Administered 2019-11-21: 0.4 mg via OPHTHALMIC

## 2019-11-21 MED ORDER — PHENYLEPHRINE-KETOROLAC 1-0.3 % IO SOLN
INTRAOCULAR | Status: DC | PRN
  Administered 2019-11-21: 500 mL via OPHTHALMIC

## 2019-11-21 MED ORDER — LIDOCAINE HCL 3.5 % OP GEL
1.0000 "application " | Freq: Once | OPHTHALMIC | Status: AC
  Administered 2019-11-21: 1 via OPHTHALMIC

## 2019-11-21 MED ORDER — PHENYLEPHRINE-KETOROLAC 1-0.3 % IO SOLN
INTRAOCULAR | Status: AC
  Filled 2019-11-21: qty 4

## 2019-11-21 MED ORDER — SODIUM HYALURONATE 23 MG/ML IO SOLN
INTRAOCULAR | Status: DC | PRN
  Administered 2019-11-21: 0.6 mL via INTRAOCULAR

## 2019-11-21 MED ORDER — BSS IO SOLN
INTRAOCULAR | Status: DC | PRN
  Administered 2019-11-21: 15 mL via INTRAOCULAR

## 2019-11-21 MED ORDER — DEXAMETHASONE 0.4 MG OP INST
VAGINAL_INSERT | OPHTHALMIC | Status: AC
  Filled 2019-11-21: qty 1

## 2019-11-21 MED ORDER — TETRACAINE HCL 0.5 % OP SOLN
1.0000 [drp] | OPHTHALMIC | Status: AC | PRN
  Administered 2019-11-21 (×3): 1 [drp] via OPHTHALMIC

## 2019-11-21 MED ORDER — POVIDONE-IODINE 5 % OP SOLN
OPHTHALMIC | Status: DC | PRN
  Administered 2019-11-21: 1 via OPHTHALMIC

## 2019-11-21 MED ORDER — LIDOCAINE HCL (PF) 1 % IJ SOLN
INTRAOCULAR | Status: DC | PRN
  Administered 2019-11-21: 1 mL via OPHTHALMIC

## 2019-11-21 MED ORDER — PHENYLEPHRINE HCL 2.5 % OP SOLN
1.0000 [drp] | OPHTHALMIC | Status: AC | PRN
  Administered 2019-11-21 (×3): 1 [drp] via OPHTHALMIC

## 2019-11-21 MED ORDER — PROVISC 10 MG/ML IO SOLN
INTRAOCULAR | Status: DC | PRN
  Administered 2019-11-21: 0.85 mL via INTRAOCULAR

## 2019-11-21 MED ORDER — CYCLOPENTOLATE-PHENYLEPHRINE 0.2-1 % OP SOLN
1.0000 [drp] | OPHTHALMIC | Status: AC | PRN
  Administered 2019-11-21 (×3): 1 [drp] via OPHTHALMIC

## 2019-11-21 SURGICAL SUPPLY — 14 items
CLOTH BEACON ORANGE TIMEOUT ST (SAFETY) ×2 IMPLANT
EYE SHIELD UNIVERSAL CLEAR (GAUZE/BANDAGES/DRESSINGS) ×2 IMPLANT
GLOVE BIOGEL PI IND STRL 7.0 (GLOVE) IMPLANT
GLOVE BIOGEL PI INDICATOR 7.0 (GLOVE) ×4
LENS ALC ACRYL/TECN (Ophthalmic Related) ×2 IMPLANT
NDL HYPO 18GX1.5 BLUNT FILL (NEEDLE) IMPLANT
NEEDLE HYPO 18GX1.5 BLUNT FILL (NEEDLE) ×3 IMPLANT
PAD ARMBOARD 7.5X6 YLW CONV (MISCELLANEOUS) ×2 IMPLANT
RING MALYGIN 7.0 (MISCELLANEOUS) ×2 IMPLANT
SYR TB 1ML LL NO SAFETY (SYRINGE) ×2 IMPLANT
TAPE SURG TRANSPORE 1 IN (GAUZE/BANDAGES/DRESSINGS) IMPLANT
TAPE SURGICAL TRANSPORE 1 IN (GAUZE/BANDAGES/DRESSINGS) ×3
VISCOELASTIC ADDITIONAL (OPHTHALMIC RELATED) ×2 IMPLANT
WATER STERILE IRR 250ML POUR (IV SOLUTION) ×2 IMPLANT

## 2019-11-21 NOTE — Discharge Instructions (Addendum)
Please discharge patient when stable, will follow up today with Dr. Wrzosek at the [facility] office immediately following discharge.  Leave shield in place until visit.  All paperwork with discharge instructions will be given at the office.   [facility] Address:  [address]  [place], [place] [address]  

## 2019-11-21 NOTE — Transfer of Care (Signed)
Immediate Anesthesia Transfer of Care Note  Patient: Darin Moran  Procedure(s) Performed: CATARACT EXTRACTION PHACO AND INTRAOCULAR LENS PLACEMENT (IOC) CDE:  15.38 (Left Eye)  Patient Location: PACU  Anesthesia Type:MAC  Level of Consciousness: awake, alert , oriented and patient cooperative  Airway & Oxygen Therapy: Patient Spontanous Breathing  Post-op Assessment: Report given to RN and Post -op Vital signs reviewed and stable  Post vital signs: Reviewed and stable  Last Vitals:  Vitals Value Taken Time  BP    Temp    Pulse    Resp    SpO2      Last Pain:  Vitals:   11/21/19 1045  PainSc: 0-No pain         Complications: No apparent anesthesia complications

## 2019-11-21 NOTE — Anesthesia Postprocedure Evaluation (Signed)
Anesthesia Post Note  Patient: Darin Moran  Procedure(s) Performed: CATARACT EXTRACTION PHACO AND INTRAOCULAR LENS PLACEMENT (IOC) CDE:  15.38 (Left Eye)  Patient location during evaluation: PACU Anesthesia Type: MAC Level of consciousness: awake, awake and alert, oriented and patient cooperative Pain management: pain level controlled Vital Signs Assessment: post-procedure vital signs reviewed and stable Respiratory status: spontaneous breathing, nonlabored ventilation and respiratory function stable Cardiovascular status: stable Postop Assessment: no apparent nausea or vomiting Anesthetic complications: no     Last Vitals:  Vitals:   11/21/19 1043 11/21/19 1045  BP:  (!) 172/84  Pulse:  86  Resp:  16  Temp:  36.4 C  SpO2: 92% 96%    Last Pain:  Vitals:   11/21/19 1045  PainSc: 0-No pain                 Koah Chisenhall

## 2019-11-21 NOTE — Op Note (Signed)
Date of procedure: 11/21/19  Pre-operative diagnosis: Visually significant cataract, Left Eye; Poor dilation, Left eye (H25.?2; H21.81)  Post-operative diagnosis: Visually significant cataract, Left Eye; Intra-operative Floppy Iris Syndrome, Left Eye  Procedure:  1. Complex removal of cataract via phacoemulsification and insertion of intra-ocular lens Johnson and Pershing  +22.5D into the capsular bag of the Left Eye (CPT [phone]) 2. Placement of Dextenza implant, left eye  Attending surgeon: Gerda Diss. Jashun Puertas, MD, MA  Anesthesia: MAC, Topical Akten  Complications: None  Estimated Blood Loss: <20m (minimal)  Specimens: None  Implants: As above  Indications:  Visually significant cataract, Left Eye  Procedure:  The patient was seen and identified in the pre-operative area. The operative eye was identified and dilated.  The operative eye was marked.  Topical anesthesia was administered to the operative eye.     The patient was then to the operative suite and placed in the supine position.  A timeout was performed confirming the patient, procedure to be performed, and all other relevant information.   The patient's face was prepped and draped in the usual fashion for intra-ocular surgery.  A lid speculum was placed into the operative eye and the surgical microscope moved into place and focused.  Poor dilation of the iris was confirmed.  An inferotemporal paracentesis was created using a 20 gauge paracentesis blade.  Shugarcaine was injected into the anterior chamber.  Viscoelastic was injected into the anterior chamber.  A temporal clear-corneal main wound incision was created using a 2.490mmicrokeratome.  A Malyugin ring was placed.  A continuous curvilinear capsulorrhexis was initiated using an irrigating cystitome and completed using capsulorrhexis forceps.  Hydrodissection and hydrodeliniation were performed.  Viscoelastic was injected into the anterior chamber.  A  phacoemulsification handpiece and a chopper as a second instrument were used to remove the nucleus and epinucleus. The irrigation/aspiration handpiece was used to remove any remaining cortical material.   The capsular bag was reinflated with viscoelastic, checked, and found to be intact.  The intraocular lens was inserted into the capsular bag and dialed into place using a Kuglen hook. The Malyugin ring was removed.  The irrigation/aspiration handpiece was used to remove any remaining viscoelastic.  The clear corneal wound and paracentesis wounds were then hydrated and checked with Weck-Cels to be watertight.  The lid-speculum and drape was removed. A Dextenza implant was placed in the lower canaliculus. The patient's face was cleaned with a wet and dry 4x4.  Maxitrol was instilled in the eye before a clear shield was taped over the eye. The patient was taken to the post-operative care unit in good condition, having tolerated the procedure well.  Post-Op Instructions: [facility] and will receive all other orders and instructions.

## 2019-11-21 NOTE — Progress Notes (Signed)
Left hearing aid removed per pt, placed in pink denture cup. Denture cup given to wife.

## 2019-11-21 NOTE — Anesthesia Preprocedure Evaluation (Addendum)
Anesthesia Evaluation  Patient identified by MRN, date of birth, ID band Patient awake    Reviewed: Allergy & Precautions, NPO status , Patient's Chart, lab work & pertinent test results  History of Anesthesia Complications Negative for: history of anesthetic complications  Airway Mallampati: III  TM Distance: >3 FB Neck ROM: Full    Dental  (+) Caps, Dental Advisory Given   Pulmonary former smoker,    Pulmonary exam normal breath sounds clear to auscultation       Cardiovascular hypertension, Pt. on medications Normal cardiovascular exam Rhythm:Regular Rate:Normal     Neuro/Psych negative neurological ROS  negative psych ROS   GI/Hepatic negative GI ROS, Neg liver ROS,   Endo/Other  diabetes, Well Controlled, Type 2, Oral Hypoglycemic Agents  Renal/GU      Musculoskeletal  (+) Arthritis , Osteoarthritis,    Abdominal   Peds  Hematology   Anesthesia Other Findings   Reproductive/Obstetrics                           Anesthesia Physical Anesthesia Plan  ASA: III  Anesthesia Plan: MAC   Post-op Pain Management:    Induction:   PONV Risk Score and Plan:   Airway Management Planned: Nasal Cannula and Natural Airway  Additional Equipment:   Intra-op Plan:   Post-operative Plan:   Informed Consent: I have reviewed the patients History and Physical, chart, labs and discussed the procedure including the risks, benefits and alternatives for the proposed anesthesia with the patient or authorized representative who has indicated his/her understanding and acceptance.     Dental advisory given  Plan Discussed with: CRNA and Surgeon  Anesthesia Plan Comments:        Anesthesia Quick Evaluation

## 2019-11-21 NOTE — Interval H&P Note (Signed)
History and Physical Interval Note: The H and P was reviewed and updated. The patient was examined.  No changes were found after exam.  The surgical eye was marked.  11/21/2019 11:13 AM  Darin Moran  has presented today for surgery, with the diagnosis of Nuclear sclerotic cataract - Left eye.  The various methods of treatment have been discussed with the patient and family. After consideration of risks, benefits and other options for treatment, the patient has consented to  Procedure(s) with comments: CATARACT EXTRACTION PHACO AND INTRAOCULAR LENS PLACEMENT ([place]) (Left) - left as a surgical intervention.  The patient's history has been reviewed, patient examined, no change in status, stable for surgery.  I have reviewed the patient's chart and labs.  Questions were answered to the patient's satisfaction.     Baruch Goldmann

## 2019-11-21 NOTE — Addendum Note (Signed)
Addendum  created 11/21/19 1158 by Ollen Bowl, CRNA   Charge Capture section accepted

## 2019-11-28 DIAGNOSIS — H25811 Combined forms of age-related cataract, right eye: Secondary | ICD-10-CM | POA: Diagnosis not present

## 2019-11-29 NOTE — H&P (Signed)
Surgical History & Physical  Patient Name: Darin Moran DOB: 04/16/1927  Surgery: Cataract extraction with intraocular lens implant phacoemulsification; Right Eye  Surgeon: Baruch Goldmann MD Surgery Date:  12/05/2019 Pre-Op Date:  11/28/2019  HPI: A 73 Yr. old male patient 1. The patient is returning after cataract post-op. The left eye is affected. Since the last visit, the affected area is doing well. The condition's severity is none. Patient is following medication instructions. He reports having a blue haze appearance in his vision- only in bright light settings. PT is ready to have his right eye surgery. He reports blurred vision OD. He states his glasses do not help his right eye. Pt denies eye pain. No flashes or floaters. HPI was performed by Baruch Goldmann .  Medical History: Cataracts Arthritis Diabetes High Blood Pressure LDL extreme HOH pseudoGout-resolved  Review of Systems Ear, Nose, Mouth & Throat Deafness Musculoskeletal Joint Ache, Stiffness All recorded systems are negative except as noted above.  Social   Former smoker  Medication Icaps,  Pravastatin, Hydrochlorothiazide, Pioglitazone HCL, Quinapril, Metformin, Multivitamin, Vitamin D3,   Sx/Procedures Phaco c IOL OS with Dextenza,   Drug Allergies   NKDA  History & Physical: Heent:  Cataract, Right eye NECK: supple without bruits LUNGS: lungs clear to auscultation CV: regular rate and rhythm Abdomen: soft and non-tender  Impression & Plan: Assessment: 1.  CATARACT EXTRACTION STATUS; Left Eye (Z98.42) 2.  COMBINED FORMS AGE RELATED CATARACT; , Right Eye (H25.811)  Plan: 1.  with Dextenza. Same day post-op exam. Doing well. All post-op precautions discussed and instructions reviewed. Written instructions given. 2.  Dilates poorly - shugacaine by protocol. Cataract accounts for the patient's decreased vision. This visual impairment is not correctable with a tolerable change in glasses or contact  lenses. Cataract surgery with an implantation of a new lens should significantly improve the visual and functional status of the patient. Discussed all risks, benefits, alternatives, and potential complications. Discussed the procedures and recovery. Patient desires to have surgery. A-scan ordered and performed today for intra-ocular lens calculations. The surgery will be performed in order to improve vision for driving, reading, and for eye examinations. Recommend phacoemulsification with intra-ocular lens. Right Eye. Surgery required to correct imbalance of vision. Malyugin Ring. in room. O[place]. Dextenza.

## 2019-12-02 ENCOUNTER — Other Ambulatory Visit: Payer: Self-pay

## 2019-12-02 ENCOUNTER — Other Ambulatory Visit (HOSPITAL_COMMUNITY)
Admission: RE | Admit: 2019-12-02 | Discharge: 2019-12-02 | Disposition: A | Discharge status: Home / Self Care | Payer: Medicare Other | Source: Ambulatory Visit | Attending: Ophthalmology | Admitting: Ophthalmology

## 2019-12-02 ENCOUNTER — Encounter (HOSPITAL_COMMUNITY)
Admission: RE | Admit: 2019-12-02 | Discharge: 2019-12-02 | Disposition: A | Discharge status: Home / Self Care | Payer: Medicare Other | Source: Ambulatory Visit | Attending: Ophthalmology | Admitting: Ophthalmology

## 2019-12-02 DIAGNOSIS — Z01812 Encounter for preprocedural laboratory examination: Secondary | ICD-10-CM | POA: Insufficient documentation

## 2019-12-02 DIAGNOSIS — Z20822 Contact with and (suspected) exposure to covid-19: Secondary | ICD-10-CM | POA: Insufficient documentation

## 2019-12-02 LAB — SARS CORONAVIRUS 2 (TAT 6-24 HRS): SARS Coronavirus 2: NEGATIVE

## 2019-12-02 NOTE — Patient Instructions (Signed)
Your procedure is scheduled on:  12/05/2019               Report to [facility] at  7:45   AM.                Call this number if you have problems the morning of surgery: [phone]   Do not eat or drink :After Midnight.    Take these medicines the morning of surgery with A SIP OF WATER:  Celebrex          Do not wear jewelry, make-up or nail polish.  Do not wear lotions, powders, or perfumes. You may wear deodorant.  Do not bring valuables to the hospital.  Contacts, dentures or bridgework may not be worn into surgery.  Patients discharged the day of surgery will not be allowed to drive home.  Name and phone number of your driver.                                                                                                                                       Cataract Surgery  A cataract is a clouding of the lens of the eye. When a lens becomes cloudy, vision is reduced based on the degree and nature of the clouding. Surgery may be needed to improve vision. Surgery removes the cloudy lens and usually replaces it with a substitute lens (intraocular lens, IOL). LET YOUR EYE DOCTOR KNOW ABOUT:  Allergies to food or medicine.   Medicines taken including herbs, eyedrops, over-the-counter medicines, and creams.   Use of steroids (by mouth or creams).   Previous problems with anesthetics or numbing medicine.   History of bleeding problems or blood clots.   Previous surgery.   Other health problems, including diabetes and kidney problems.   Possibility of pregnancy, if this applies.  RISKS AND COMPLICATIONS  Infection.   Inflammation of the eyeball (endophthalmitis) that can spread to both eyes (sympathetic ophthalmia).   Poor wound healing.   If an IOL is inserted, it can later fall out of proper position. This is very uncommon.   Clouding of the part of your eye that holds an IOL in place. This is called an "after-cataract." These are uncommon, but easily treated.    BEFORE THE PROCEDURE  Do not eat or drink anything except small amounts of water for 8 to 12 before your surgery, or as directed by your caregiver.    Unless you are told otherwise, continue any eyedrops you have been prescribed.   Talk to your primary caregiver about all other medicines that you take (both prescription and non-prescription). In some cases, you may need to stop or change medicines near the time of your surgery. This is most important if you are taking blood-thinning medicine. Do not stop medicines unless you are told to do so.   Arrange for someone to drive you to and from the  procedure.   Do not put contact lenses in either eye on the day of your surgery.  PROCEDURE There is more than one method for safely removing a cataract. Your doctor can explain the differences and help determine which is best for you. Phacoemulsification surgery is the most common form of cataract surgery.  An injection is given behind the eye or eyedrops are given to make this a painless procedure.   A small cut (incision) is made on the edge of the clear, dome-shaped surface that covers the front of the eye (cornea).   A tiny probe is painlessly inserted into the eye. This device gives off ultrasound waves that soften and break up the cloudy center of the lens. This makes it easier for the cloudy lens to be removed by suction.   An IOL may be implanted.   The normal lens of the eye is covered by a clear capsule. Part of that capsule is intentionally left in the eye to support the IOL.   Your surgeon may or may not use stitches to close the incision.  There are other forms of cataract surgery that require a larger incision and stiches to close the eye. This approach is taken in cases where the doctor feels that the cataract cannot be easily removed using phacoemulsification. AFTER THE PROCEDURE  When an IOL is implanted, it does not need care. It becomes a permanent part of your eye and cannot  be seen or felt.   Your doctor will schedule follow-up exams to check on your progress.   Review your other medicines with your doctor to see which can be resumed after surgery.   Use eyedrops or take medicine as prescribed by your doctor.  Document Released: 06/19/2011 Document Reviewed: 06/16/2011 [facility] Patient Information 2012 [place].  .Cataract Surgery Care After Refer to this sheet in the next few weeks. These instructions provide you with information on caring for yourself after your procedure. Your caregiver may also give you more specific instructions. Your treatment has been planned according to current medical practices, but problems sometimes occur. Call your caregiver if you have any problems or questions after your procedure.  HOME CARE INSTRUCTIONS   Avoid strenuous activities as directed by your caregiver.   Ask your caregiver when you can resume driving.   Use eyedrops or other medicines to help healing and control pressure inside your eye as directed by your caregiver.   Only take over-the-counter or prescription medicines for pain, discomfort, or fever as directed by your caregiver.   Do not to touch or rub your eyes.   You may be instructed to use a protective shield during the first few days and nights after surgery. If not, wear sunglasses to protect your eyes. This is to protect the eye from pressure or from being accidentally bumped.   Keep the area around your eye clean and dry. Avoid swimming or allowing water to hit you directly in the face while showering. Keep soap and shampoo out of your eyes.   Do not bend or lift heavy objects. Bending increases pressure in the eye. You can walk, climb stairs, and do light household chores.   Do not put a contact lens into the eye that had surgery until your caregiver says it is okay to do so.   Ask your doctor when you can return to work. This will depend on the kind of work that you do. If you work in a  dusty environment, you may be  advised to wear protective eyewear for a period of time.   Ask your caregiver when it will be safe to engage in sexual activity.   Continue with your regular eye exams as directed by your caregiver.  What to expect:  It is normal to feel itching and mild discomfort for a few days after cataract surgery. Some fluid discharge is also common, and your eye may be sensitive to light and touch.   After 1 to 2 days, even moderate discomfort should disappear. In most cases, healing will take about 6 weeks.   If you received an intraocular lens (IOL), you may notice that colors are very bright or have a blue tinge. Also, if you have been in bright sunlight, everything may appear reddish for a few hours. If you see these color tinges, it is because your lens is clear and no longer cloudy. Within a few months after receiving an IOL, these extra colors should go away. When you have healed, you will probably need new glasses.  SEEK MEDICAL CARE IF:   You have increased bruising around your eye.   You have discomfort not helped by medicine.  SEEK IMMEDIATE MEDICAL CARE IF:   You have a  fever.   You have a worsening or sudden vision loss.   You have redness, swelling, or increasing pain in the eye.   You have a thick discharge from the eye that had surgery.  MAKE SURE YOU:  Understand these instructions.   Will watch your condition.   Will get help right away if you are not doing well or get worse.  Document Released: 01/17/2005 Document Revised: 06/19/2011 Document Reviewed: 02/21/2011 [facility] Patient Information 2012 [place].    Monitored Anesthesia Care  Monitored anesthesia care is an anesthesia service for a medical procedure. Anesthesia is the loss of the ability to feel pain. It is produced by medications called anesthetics. It may affect a small area of your body (local anesthesia), a large area of your body (regional anesthesia), or your entire  body (general anesthesia). The need for monitored anesthesia care depends your procedure, your condition, and the potential need for regional or general anesthesia. It is often provided during procedures where:   General anesthesia may be needed if there are complications. This is because you need special care when you are under general anesthesia.    You will be under local or regional anesthesia. This is so that you are able to have higher levels of anesthesia if needed.    You will receive calming medications (sedatives). This is especially the case if sedatives are given to put you in a semi-conscious state of relaxation (deep sedation). This is because the amount of sedative needed to produce this state can be hard to predict. Too much of a sedative can produce general anesthesia. Monitored anesthesia care is performed by one or more caregivers who have special training in all types of anesthesia. You will need to meet with these caregivers before your procedure. During this meeting, they will ask you about your medical history. They will also give you instructions to follow. (For example, you will need to stop eating and drinking before your procedure. You may also need to stop or change medications you are taking.) During your procedure, your caregivers will stay with you. They will:   Watch your condition. This includes watching you blood pressure, breathing, and level of pain.    Diagnose and treat problems that occur.    Give medications  if they are needed. These may include calming medications (sedatives) and anesthetics.    Make sure you are comfortable.   Having monitored anesthesia care does not necessarily mean that you will be under anesthesia. It does mean that your caregivers will be able to manage anesthesia if you need it or if it occurs. It also means that you will be able to have a different type of anesthesia than you are having if you need it. When your procedure is complete,  your caregivers will continue to watch your condition. They will make sure any medications wear off before you are allowed to go home.  Document Released: 03/26/2005 Document Revised: 10/25/2012 Document Reviewed: 08/11/2012 [facility] Patient Information 2014 [place], [place].

## 2019-12-05 ENCOUNTER — Ambulatory Visit (HOSPITAL_COMMUNITY)
Admission: RE | Admit: 2019-12-05 | Discharge: 2019-12-05 | Disposition: A | Discharge status: Home / Self Care | Payer: Medicare Other | Attending: Ophthalmology | Admitting: Ophthalmology

## 2019-12-05 ENCOUNTER — Ambulatory Visit (HOSPITAL_COMMUNITY): Payer: Medicare Other | Admitting: Anesthesiology

## 2019-12-05 ENCOUNTER — Encounter (HOSPITAL_COMMUNITY)
Admission: RE | Disposition: A | Discharge status: Home / Self Care | Payer: Self-pay | Source: Home / Self Care | Attending: Ophthalmology

## 2019-12-05 DIAGNOSIS — H2181 Floppy iris syndrome: Secondary | ICD-10-CM | POA: Diagnosis not present

## 2019-12-05 DIAGNOSIS — M1990 Unspecified osteoarthritis, unspecified site: Secondary | ICD-10-CM | POA: Diagnosis not present

## 2019-12-05 DIAGNOSIS — Z87891 Personal history of nicotine dependence: Secondary | ICD-10-CM | POA: Diagnosis not present

## 2019-12-05 DIAGNOSIS — E119 Type 2 diabetes mellitus without complications: Secondary | ICD-10-CM | POA: Diagnosis not present

## 2019-12-05 DIAGNOSIS — E1136 Type 2 diabetes mellitus with diabetic cataract: Secondary | ICD-10-CM | POA: Insufficient documentation

## 2019-12-05 DIAGNOSIS — H25811 Combined forms of age-related cataract, right eye: Secondary | ICD-10-CM | POA: Insufficient documentation

## 2019-12-05 DIAGNOSIS — I10 Essential (primary) hypertension: Secondary | ICD-10-CM | POA: Diagnosis not present

## 2019-12-05 HISTORY — PX: CATARACT EXTRACTION W/PHACO: SHX586

## 2019-12-05 LAB — GLUCOSE, CAPILLARY: Glucose-Capillary: 121 mg/dL — ABNORMAL HIGH (ref 70–99)

## 2019-12-05 SURGERY — PHACOEMULSIFICATION, CATARACT, WITH IOL INSERTION
Anesthesia: Monitor Anesthesia Care | Site: Eye | Laterality: Right

## 2019-12-05 MED ORDER — BSS IO SOLN
INTRAOCULAR | Status: DC | PRN
  Administered 2019-12-05: 15 mL via INTRAOCULAR

## 2019-12-05 MED ORDER — PHENYLEPHRINE HCL 2.5 % OP SOLN
1.0000 [drp] | OPHTHALMIC | Status: AC | PRN
  Administered 2019-12-05 (×3): 1 [drp] via OPHTHALMIC

## 2019-12-05 MED ORDER — PHENYLEPHRINE-KETOROLAC 1-0.3 % IO SOLN
INTRAOCULAR | Status: AC
  Filled 2019-12-05: qty 4

## 2019-12-05 MED ORDER — LIDOCAINE HCL 3.5 % OP GEL
1.0000 "application " | Freq: Once | OPHTHALMIC | Status: AC
  Administered 2019-12-05: 1 via OPHTHALMIC

## 2019-12-05 MED ORDER — POVIDONE-IODINE 5 % OP SOLN
OPHTHALMIC | Status: DC | PRN
  Administered 2019-12-05: 1 via OPHTHALMIC

## 2019-12-05 MED ORDER — CYCLOPENTOLATE-PHENYLEPHRINE 0.2-1 % OP SOLN
1.0000 [drp] | OPHTHALMIC | Status: AC | PRN
  Administered 2019-12-05 (×3): 1 [drp] via OPHTHALMIC

## 2019-12-05 MED ORDER — LIDOCAINE HCL (PF) 1 % IJ SOLN
INTRAOCULAR | Status: DC | PRN
  Administered 2019-12-05: 1 mL via OPHTHALMIC

## 2019-12-05 MED ORDER — EPINEPHRINE PF 1 MG/ML IJ SOLN
INTRAMUSCULAR | Status: AC
  Filled 2019-12-05: qty 2

## 2019-12-05 MED ORDER — DEXAMETHASONE 0.4 MG OP INST
VAGINAL_INSERT | OPHTHALMIC | Status: DC | PRN
  Administered 2019-12-05: 0.4 mg via OPHTHALMIC

## 2019-12-05 MED ORDER — PHENYLEPHRINE-KETOROLAC 1-0.3 % IO SOLN
INTRAOCULAR | Status: DC | PRN
  Administered 2019-12-05: 500 mL via OPHTHALMIC

## 2019-12-05 MED ORDER — TETRACAINE HCL 0.5 % OP SOLN
1.0000 [drp] | OPHTHALMIC | Status: AC | PRN
  Administered 2019-12-05 (×3): 1 [drp] via OPHTHALMIC

## 2019-12-05 MED ORDER — SODIUM HYALURONATE 23 MG/ML IO SOLN
INTRAOCULAR | Status: DC | PRN
  Administered 2019-12-05: 0.6 mL via INTRAOCULAR

## 2019-12-05 MED ORDER — PROVISC 10 MG/ML IO SOLN
INTRAOCULAR | Status: DC | PRN
  Administered 2019-12-05: 0.85 mL via INTRAOCULAR

## 2019-12-05 MED ORDER — DEXAMETHASONE 0.4 MG OP INST
VAGINAL_INSERT | OPHTHALMIC | Status: AC
  Filled 2019-12-05: qty 1

## 2019-12-05 SURGICAL SUPPLY — 14 items
CLOTH BEACON ORANGE TIMEOUT ST (SAFETY) ×2 IMPLANT
EYE SHIELD UNIVERSAL CLEAR (GAUZE/BANDAGES/DRESSINGS) ×2 IMPLANT
GLOVE BIOGEL PI IND STRL 7.0 (GLOVE) IMPLANT
GLOVE BIOGEL PI INDICATOR 7.0 (GLOVE) ×4
LENS ALC ACRYL/TECN (Ophthalmic Related) ×2 IMPLANT
NDL HYPO 18GX1.5 BLUNT FILL (NEEDLE) IMPLANT
NEEDLE HYPO 18GX1.5 BLUNT FILL (NEEDLE) ×3 IMPLANT
PAD ARMBOARD 7.5X6 YLW CONV (MISCELLANEOUS) ×2 IMPLANT
RING MALYGIN 7.0 (MISCELLANEOUS) ×2 IMPLANT
SYR TB 1ML LL NO SAFETY (SYRINGE) ×2 IMPLANT
TAPE SURG TRANSPORE 1 IN (GAUZE/BANDAGES/DRESSINGS) IMPLANT
TAPE SURGICAL TRANSPORE 1 IN (GAUZE/BANDAGES/DRESSINGS) ×3
VISCOELASTIC ADDITIONAL (OPHTHALMIC RELATED) ×2 IMPLANT
WATER STERILE IRR 250ML POUR (IV SOLUTION) ×2 IMPLANT

## 2019-12-05 NOTE — Transfer of Care (Signed)
Immediate Anesthesia Transfer of Care Note  Patient: Darin Moran  Procedure(s) Performed: CATARACT EXTRACTION PHACO AND INTRAOCULAR LENS PLACEMENT RIGHT EYE (Right Eye)  Patient Location: PACU  Anesthesia Type:MAC  Level of Consciousness: awake, alert , oriented and patient cooperative  Airway & Oxygen Therapy: Patient Spontanous Breathing  Post-op Assessment: Report given to RN, Post -op Vital signs reviewed and stable and Patient moving all extremities X 4  Post vital signs: Reviewed and stable  Last Vitals:  Vitals Value Taken Time  BP    Temp    Pulse    Resp    SpO2      Last Pain:  Vitals:   12/05/19 0857  TempSrc: Oral  PainSc: 0-No pain      Patients Stated Pain Goal: 5 (63/78/58 [id number])  Complications: No apparent anesthesia complications

## 2019-12-05 NOTE — Interval H&P Note (Signed)
History and Physical Interval Note: The H and P was reviewed and updated. The patient was examined.  No changes were found after exam.  The surgical eye was marked.   12/05/2019 9:29 AM  Darin Moran  has presented today for surgery, with the diagnosis of Nuclear sclerotic cataract - Right eye.  The various methods of treatment have been discussed with the patient and family. After consideration of risks, benefits and other options for treatment, the patient has consented to  Procedure(s) with comments: CATARACT EXTRACTION PHACO AND INTRAOCULAR LENS PLACEMENT ([facility]) (Right) - right as a surgical intervention.  The patient's history has been reviewed, patient examined, no change in status, stable for surgery.  I have reviewed the patient's chart and labs.  Questions were answered to the patient's satisfaction.     Darin Moran

## 2019-12-05 NOTE — Anesthesia Preprocedure Evaluation (Signed)
Anesthesia Evaluation  Patient identified by MRN, date of birth, ID band Patient awake    Reviewed: Allergy & Precautions, NPO status , Patient's Chart, lab work & pertinent test results  History of Anesthesia Complications Negative for: history of anesthetic complications  Airway Mallampati: III  TM Distance: >3 FB Neck ROM: Full    Dental  (+) Caps, Dental Advisory Given   Pulmonary former smoker,    Pulmonary exam normal breath sounds clear to auscultation       Cardiovascular hypertension, Pt. on medications Normal cardiovascular exam Rhythm:Regular Rate:Normal     Neuro/Psych negative neurological ROS  negative psych ROS   GI/Hepatic negative GI ROS, Neg liver ROS,   Endo/Other  diabetes, Well Controlled, Type 2, Oral Hypoglycemic Agents  Renal/GU      Musculoskeletal  (+) Arthritis , Osteoarthritis,    Abdominal   Peds  Hematology   Anesthesia Other Findings   Reproductive/Obstetrics                             Anesthesia Physical  Anesthesia Plan  ASA: III  Anesthesia Plan: MAC   Post-op Pain Management:    Induction:   PONV Risk Score and Plan:   Airway Management Planned: Nasal Cannula and Natural Airway  Additional Equipment:   Intra-op Plan:   Post-operative Plan:   Informed Consent: I have reviewed the patients History and Physical, chart, labs and discussed the procedure including the risks, benefits and alternatives for the proposed anesthesia with the patient or authorized representative who has indicated his/her understanding and acceptance.     Dental advisory given  Plan Discussed with: CRNA and Surgeon  Anesthesia Plan Comments:         Anesthesia Quick Evaluation

## 2019-12-05 NOTE — Discharge Instructions (Signed)
Please discharge patient when stable, will follow up today with Dr. Raliyah Montella at the [facility] [place] office immediately following discharge.  Leave shield in place until visit.  All paperwork with discharge instructions will be given at the office.  [facility] Address:  [address]  [place], [place] [address]  

## 2019-12-05 NOTE — Op Note (Signed)
Date of procedure: 12/05/19  Pre-operative diagnosis: Visually significant combined-form age-related cataract, Right Eye; Poor Dilation, Right Eye (H25.811; H21.81)  Post-operative diagnosis: Visually significant cataract, Right Eye; Intra-operative Floppy Iris Syndrome, Right Eye  Procedure: 1.  Removal of cataract via phacoemulsification and insertion of intra-ocular lens [place]  +21.5D into the capsular bag of the Right Eye (CPT [phone]) 2. Placement of Dextenza implant, right eye (right lower canaliculus)  Attending surgeon: Gerda Diss. Julianne Chamberlin, MD, MA  Anesthesia: MAC, Topical [place]  Complications: None  Estimated Blood Loss: <65m (minimal)  Specimens: None  Implants: As above  Indications:  Visually significant cataract, Right Eye  Procedure:  The patient was seen and identified in the pre-operative area. The operative eye was identified and dilated.  The operative eye was marked.  Topical anesthesia was administered to the operative eye.     The patient was then to the operative suite and placed in the supine position.  A timeout was performed confirming the patient, procedure to be performed, and all other relevant information.   The patient's face was prepped and draped in the usual fashion for intra-ocular surgery.  A lid speculum was placed into the operative eye and the surgical microscope moved into place and focused.  Poor dilation of the iris was confirmed.  A superotemporal paracentesis was created using a 20 gauge paracentesis blade.  Shugarcaine was injected into the anterior chamber.  Viscoelastic was injected into the anterior chamber.  A temporal clear-corneal main wound incision was created using a 2.412mmicrokeratome.  A Malyugin ring was placed.  A continuous curvilinear capsulorrhexis was initiated using an irrigating cystitome and completed using capsulorrhexis forceps.  Hydrodissection and hydrodeliniation were performed.  Viscoelastic was  injected into the anterior chamber.  A phacoemulsification handpiece and a chopper as a second instrument were used to remove the nucleus and epinucleus. The irrigation/aspiration handpiece was used to remove any remaining cortical material.   The capsular bag was reinflated with viscoelastic, checked, and found to be intact.  The intraocular lens was inserted into the capsular bag and dialed into place using a Kuglen hook.  The Malyugin ring was removed.  The irrigation/aspiration handpiece was used to remove any remaining viscoelastic.  The clear corneal wound and paracentesis wounds were then hydrated and checked with Weck-Cels to be watertight.  The lid-speculum and drape was removed, and the patient's face was cleaned with a wet and dry 4x4.  A Dextenza implant was placed in the lower canaliculus. Maxitrol was instilled in the eye before a clear shield was taped over the eye. The patient was taken to the post-operative care unit in good condition, having tolerated the procedure well.  Post-Op Instructions: [facility] and will receive all other orders and instructions.

## 2019-12-05 NOTE — Anesthesia Postprocedure Evaluation (Signed)
Anesthesia Post Note  Patient: Darin Moran  Procedure(s) Performed: CATARACT EXTRACTION PHACO AND INTRAOCULAR LENS PLACEMENT RIGHT EYE (Right Eye)  Patient location during evaluation: Phase II Anesthesia Type: MAC Level of consciousness: awake, oriented, awake and alert and patient cooperative Pain management: satisfactory to patient Vital Signs Assessment: post-procedure vital signs reviewed and stable Respiratory status: spontaneous breathing, respiratory function stable and nonlabored ventilation Cardiovascular status: stable Postop Assessment: no apparent nausea or vomiting Anesthetic complications: no     Last Vitals:  Vitals:   12/05/19 0857 12/05/19 0911  BP: (!) 183/79 (!) 163/83  Pulse: 80 78  Resp: 16 (!) 23  Temp: 36.5 C   SpO2: 96% 96%    Last Pain:  Vitals:   12/05/19 0857  TempSrc: Oral  PainSc: 0-No pain                 Samanthajo Payano

## 2019-12-09 DIAGNOSIS — E782 Mixed hyperlipidemia: Secondary | ICD-10-CM | POA: Diagnosis not present

## 2019-12-09 DIAGNOSIS — N183 Chronic kidney disease, stage 3 unspecified: Secondary | ICD-10-CM | POA: Diagnosis not present

## 2019-12-09 DIAGNOSIS — I10 Essential (primary) hypertension: Secondary | ICD-10-CM | POA: Diagnosis not present

## 2019-12-09 DIAGNOSIS — M179 Osteoarthritis of knee, unspecified: Secondary | ICD-10-CM | POA: Diagnosis not present

## 2019-12-09 DIAGNOSIS — E1169 Type 2 diabetes mellitus with other specified complication: Secondary | ICD-10-CM | POA: Diagnosis not present

## 2019-12-09 DIAGNOSIS — D500 Iron deficiency anemia secondary to blood loss (chronic): Secondary | ICD-10-CM | POA: Diagnosis not present

## 2019-12-23 ENCOUNTER — Other Ambulatory Visit: Payer: Self-pay | Admitting: Sports Medicine

## 2019-12-23 DIAGNOSIS — M170 Bilateral primary osteoarthritis of knee: Secondary | ICD-10-CM

## 2019-12-23 MED ORDER — CELECOXIB 200 MG PO CAPS: ORAL_CAPSULE | ORAL | 2 refills | Status: DC

## 2020-01-04 DIAGNOSIS — E782 Mixed hyperlipidemia: Secondary | ICD-10-CM | POA: Diagnosis not present

## 2020-01-04 DIAGNOSIS — N183 Chronic kidney disease, stage 3 unspecified: Secondary | ICD-10-CM | POA: Diagnosis not present

## 2020-01-04 DIAGNOSIS — I10 Essential (primary) hypertension: Secondary | ICD-10-CM | POA: Diagnosis not present

## 2020-01-04 DIAGNOSIS — M179 Osteoarthritis of knee, unspecified: Secondary | ICD-10-CM | POA: Diagnosis not present

## 2020-01-04 DIAGNOSIS — D500 Iron deficiency anemia secondary to blood loss (chronic): Secondary | ICD-10-CM | POA: Diagnosis not present

## 2020-01-04 DIAGNOSIS — E1169 Type 2 diabetes mellitus with other specified complication: Secondary | ICD-10-CM | POA: Diagnosis not present

## 2020-01-27 DIAGNOSIS — E782 Mixed hyperlipidemia: Secondary | ICD-10-CM | POA: Diagnosis not present

## 2020-01-27 DIAGNOSIS — D500 Iron deficiency anemia secondary to blood loss (chronic): Secondary | ICD-10-CM | POA: Diagnosis not present

## 2020-01-27 DIAGNOSIS — M179 Osteoarthritis of knee, unspecified: Secondary | ICD-10-CM | POA: Diagnosis not present

## 2020-01-27 DIAGNOSIS — E1169 Type 2 diabetes mellitus with other specified complication: Secondary | ICD-10-CM | POA: Diagnosis not present

## 2020-01-27 DIAGNOSIS — I10 Essential (primary) hypertension: Secondary | ICD-10-CM | POA: Diagnosis not present

## 2020-01-27 DIAGNOSIS — N183 Chronic kidney disease, stage 3 unspecified: Secondary | ICD-10-CM | POA: Diagnosis not present

## 2020-03-15 DIAGNOSIS — M1712 Unilateral primary osteoarthritis, left knee: Secondary | ICD-10-CM | POA: Diagnosis not present

## 2020-03-15 DIAGNOSIS — M170 Bilateral primary osteoarthritis of knee: Secondary | ICD-10-CM | POA: Diagnosis not present

## 2020-03-15 DIAGNOSIS — M25562 Pain in left knee: Secondary | ICD-10-CM | POA: Diagnosis not present

## 2020-03-15 DIAGNOSIS — M25561 Pain in right knee: Secondary | ICD-10-CM | POA: Diagnosis not present

## 2020-03-15 DIAGNOSIS — M25761 Osteophyte, right knee: Secondary | ICD-10-CM | POA: Diagnosis not present

## 2020-03-15 DIAGNOSIS — M1711 Unilateral primary osteoarthritis, right knee: Secondary | ICD-10-CM | POA: Diagnosis not present

## 2020-03-21 DIAGNOSIS — M1712 Unilateral primary osteoarthritis, left knee: Secondary | ICD-10-CM | POA: Diagnosis not present

## 2020-03-22 DIAGNOSIS — M1711 Unilateral primary osteoarthritis, right knee: Secondary | ICD-10-CM | POA: Diagnosis not present

## 2020-03-28 DIAGNOSIS — M1712 Unilateral primary osteoarthritis, left knee: Secondary | ICD-10-CM | POA: Diagnosis not present

## 2020-03-29 DIAGNOSIS — M1711 Unilateral primary osteoarthritis, right knee: Secondary | ICD-10-CM | POA: Diagnosis not present

## 2020-04-03 DIAGNOSIS — M179 Osteoarthritis of knee, unspecified: Secondary | ICD-10-CM | POA: Diagnosis not present

## 2020-04-03 DIAGNOSIS — N183 Chronic kidney disease, stage 3 unspecified: Secondary | ICD-10-CM | POA: Diagnosis not present

## 2020-04-03 DIAGNOSIS — D500 Iron deficiency anemia secondary to blood loss (chronic): Secondary | ICD-10-CM | POA: Diagnosis not present

## 2020-04-03 DIAGNOSIS — E1169 Type 2 diabetes mellitus with other specified complication: Secondary | ICD-10-CM | POA: Diagnosis not present

## 2020-04-03 DIAGNOSIS — E782 Mixed hyperlipidemia: Secondary | ICD-10-CM | POA: Diagnosis not present

## 2020-04-03 DIAGNOSIS — I10 Essential (primary) hypertension: Secondary | ICD-10-CM | POA: Diagnosis not present

## 2020-04-04 DIAGNOSIS — M1712 Unilateral primary osteoarthritis, left knee: Secondary | ICD-10-CM | POA: Diagnosis not present

## 2020-04-05 DIAGNOSIS — M1711 Unilateral primary osteoarthritis, right knee: Secondary | ICD-10-CM | POA: Diagnosis not present

## 2020-04-11 DIAGNOSIS — M1712 Unilateral primary osteoarthritis, left knee: Secondary | ICD-10-CM | POA: Diagnosis not present

## 2020-04-12 DIAGNOSIS — M1711 Unilateral primary osteoarthritis, right knee: Secondary | ICD-10-CM | POA: Diagnosis not present

## 2020-04-12 DIAGNOSIS — H04123 Dry eye syndrome of bilateral lacrimal glands: Secondary | ICD-10-CM | POA: Diagnosis not present

## 2020-04-12 DIAGNOSIS — H40033 Anatomical narrow angle, bilateral: Secondary | ICD-10-CM | POA: Diagnosis not present

## 2020-04-18 DIAGNOSIS — M1712 Unilateral primary osteoarthritis, left knee: Secondary | ICD-10-CM | POA: Diagnosis not present

## 2020-04-19 DIAGNOSIS — M1711 Unilateral primary osteoarthritis, right knee: Secondary | ICD-10-CM | POA: Diagnosis not present

## 2020-04-25 DIAGNOSIS — M1712 Unilateral primary osteoarthritis, left knee: Secondary | ICD-10-CM | POA: Diagnosis not present

## 2020-04-26 DIAGNOSIS — M1711 Unilateral primary osteoarthritis, right knee: Secondary | ICD-10-CM | POA: Diagnosis not present

## 2020-05-02 DIAGNOSIS — M25762 Osteophyte, left knee: Secondary | ICD-10-CM | POA: Diagnosis not present

## 2020-05-03 DIAGNOSIS — M25761 Osteophyte, right knee: Secondary | ICD-10-CM | POA: Diagnosis not present

## 2020-06-04 DIAGNOSIS — L820 Inflamed seborrheic keratosis: Secondary | ICD-10-CM | POA: Diagnosis not present

## 2020-06-04 DIAGNOSIS — L821 Other seborrheic keratosis: Secondary | ICD-10-CM | POA: Diagnosis not present

## 2020-06-04 DIAGNOSIS — I10 Essential (primary) hypertension: Secondary | ICD-10-CM | POA: Diagnosis not present

## 2020-06-04 DIAGNOSIS — E782 Mixed hyperlipidemia: Secondary | ICD-10-CM | POA: Diagnosis not present

## 2020-06-04 DIAGNOSIS — M179 Osteoarthritis of knee, unspecified: Secondary | ICD-10-CM | POA: Diagnosis not present

## 2020-06-04 DIAGNOSIS — Z85828 Personal history of other malignant neoplasm of skin: Secondary | ICD-10-CM | POA: Diagnosis not present

## 2020-06-04 DIAGNOSIS — E1169 Type 2 diabetes mellitus with other specified complication: Secondary | ICD-10-CM | POA: Diagnosis not present

## 2020-06-04 DIAGNOSIS — N183 Chronic kidney disease, stage 3 unspecified: Secondary | ICD-10-CM | POA: Diagnosis not present

## 2020-06-04 DIAGNOSIS — D500 Iron deficiency anemia secondary to blood loss (chronic): Secondary | ICD-10-CM | POA: Diagnosis not present

## 2020-06-06 ENCOUNTER — Ambulatory Visit: Payer: Medicare Other | Admitting: Sports Medicine

## 2020-06-12 ENCOUNTER — Other Ambulatory Visit: Payer: Self-pay

## 2020-06-12 ENCOUNTER — Ambulatory Visit: Payer: Medicare Other | Admitting: Sports Medicine

## 2020-06-12 ENCOUNTER — Ambulatory Visit (INDEPENDENT_AMBULATORY_CARE_PROVIDER_SITE_OTHER): Payer: Medicare Other | Admitting: Sports Medicine

## 2020-06-12 DIAGNOSIS — M170 Bilateral primary osteoarthritis of knee: Secondary | ICD-10-CM

## 2020-06-12 MED ORDER — CELECOXIB 200 MG PO CAPS: ORAL_CAPSULE | ORAL | 2 refills | Status: DC

## 2020-06-12 MED ORDER — COLCHICINE 0.6 MG PO TABS: 0.6000 mg | ORAL_TABLET | Freq: Two times a day (BID) | ORAL | 2 refills | Status: DC

## 2020-06-12 MED ORDER — COLCHICINE 0.6 MG PO CAPS: 1.0000 | ORAL_CAPSULE | Freq: Two times a day (BID) | ORAL | 11 refills | Status: DC

## 2020-06-12 NOTE — Addendum Note (Signed)
Addended by: Silverio Decamp on: 06/12/2020 05:20 PM   Modules accepted: Orders

## 2020-06-12 NOTE — Assessment & Plan Note (Addendum)
Darin Moran is a pleasant 86 year old male, he has known osteoarthritis with CPPD, he did relatively well with colchicine and Celebrex in the past. Ultimately he does need an arthroplasty. He does continue to discuss nonproven techniques such as stem cells, and I have warned him of the lack of evidence. Sounds like he has done another round of viscosupplementation and his family is telling me he is also getting a "[place]arton jelly" injection. If insufficient improvement with the above treatment we are going to try another steroid injection.

## 2020-06-12 NOTE — Progress Notes (Signed)
    Procedures performed today:    None.  Independent interpretation of notes and tests performed by another provider:   None.  Brief History, Exam, Impression, and Recommendations:    Primary osteoarthritis of both knees Justn is a pleasant 86 year old male, he has known osteoarthritis with CPPD, he did relatively well with colchicine and Celebrex in the past. Ultimately he does need an arthroplasty. He does continue to discuss nonproven techniques such as stem cells, and I have warned him of the lack of evidence. Sounds like he has done another round of viscosupplementation and his family is telling me he is also getting a "Wharton jelly" injection. If insufficient improvement with the above treatment we are going to try another steroid injection.    ___________________________________________ Gwen Her. Dianah Field, M.D., ABFM., CAQSM. Primary Care and [place] Instructor of [place] of [facility] of Medicine

## 2020-06-28 DIAGNOSIS — E782 Mixed hyperlipidemia: Secondary | ICD-10-CM | POA: Diagnosis not present

## 2020-06-28 DIAGNOSIS — D500 Iron deficiency anemia secondary to blood loss (chronic): Secondary | ICD-10-CM | POA: Diagnosis not present

## 2020-06-28 DIAGNOSIS — E1169 Type 2 diabetes mellitus with other specified complication: Secondary | ICD-10-CM | POA: Diagnosis not present

## 2020-06-28 DIAGNOSIS — M179 Osteoarthritis of knee, unspecified: Secondary | ICD-10-CM | POA: Diagnosis not present

## 2020-06-28 DIAGNOSIS — I10 Essential (primary) hypertension: Secondary | ICD-10-CM | POA: Diagnosis not present

## 2020-06-28 DIAGNOSIS — N183 Chronic kidney disease, stage 3 unspecified: Secondary | ICD-10-CM | POA: Diagnosis not present

## 2020-07-03 ENCOUNTER — Other Ambulatory Visit: Payer: Self-pay

## 2020-07-03 ENCOUNTER — Ambulatory Visit (INDEPENDENT_AMBULATORY_CARE_PROVIDER_SITE_OTHER): Payer: Medicare Other | Admitting: Sports Medicine

## 2020-07-03 DIAGNOSIS — M79604 Pain in right leg: Secondary | ICD-10-CM | POA: Diagnosis not present

## 2020-07-03 DIAGNOSIS — M170 Bilateral primary osteoarthritis of knee: Secondary | ICD-10-CM

## 2020-07-03 DIAGNOSIS — M48061 Spinal stenosis, lumbar region without neurogenic claudication: Secondary | ICD-10-CM | POA: Insufficient documentation

## 2020-07-03 DIAGNOSIS — M4820 Kissing spine, site unspecified: Secondary | ICD-10-CM | POA: Insufficient documentation

## 2020-07-03 DIAGNOSIS — M79605 Pain in left leg: Secondary | ICD-10-CM | POA: Diagnosis not present

## 2020-07-03 MED ORDER — COLCHICINE 0.6 MG PO CAPS: 1.0000 | ORAL_CAPSULE | Freq: Two times a day (BID) | ORAL | 11 refills | Status: DC

## 2020-07-03 MED ORDER — GABAPENTIN 300 MG PO CAPS: ORAL_CAPSULE | ORAL | 3 refills | Status: DC

## 2020-07-03 NOTE — Progress Notes (Signed)
    Procedures performed today:    None.  Independent interpretation of notes and tests performed by another provider:   None.  Brief History, Exam, Impression, and Recommendations:    Primary osteoarthritis of both knees Darin Moran is a pleasant 86 year old male, known arthritis and CPPD, did well with colchicine and Celebrex in the past, he is continuing to use unproven techniques such as stem cells and a Wharton jelly injection. He is doing this at a regenerative medicine practice locally. He really has not noted much improvement yet. I do think he needs a knee arthroplasty.   Bilateral leg pain Orders having bilateral radiating leg pain from below his knees to his ankles, mostly at night, I did review a CT scan of the abdomen pelvis from several years ago that showed severe multilevel lumbar spinal stenosis per Adding gabapentin nightly, return in a month.    ___________________________________________ Gwen Her. Dianah Field, M.D., ABFM., CAQSM. Primary Care and [place] Instructor of [place] of [facility] of Medicine

## 2020-07-03 NOTE — Assessment & Plan Note (Signed)
Orders having bilateral radiating leg pain from below his knees to his ankles, mostly at night, I did review a CT scan of the abdomen pelvis from several years ago that showed severe multilevel lumbar spinal stenosis per Adding gabapentin nightly, return in a month.

## 2020-07-03 NOTE — Addendum Note (Signed)
Addended by: Silverio Decamp on: 07/03/2020 11:45 AM   Modules accepted: Orders

## 2020-07-03 NOTE — Assessment & Plan Note (Signed)
Darin Moran is a pleasant 86 year old male, known arthritis and CPPD, did well with colchicine and Celebrex in the past, he is continuing to use unproven techniques such as stem cells and a Wharton jelly injection. He is doing this at a regenerative medicine practice locally. He really has not noted much improvement yet. I do think he needs a knee arthroplasty.

## 2020-07-16 ENCOUNTER — Other Ambulatory Visit: Payer: Self-pay

## 2020-07-16 ENCOUNTER — Ambulatory Visit (INDEPENDENT_AMBULATORY_CARE_PROVIDER_SITE_OTHER): Payer: Medicare Other | Admitting: Sports Medicine

## 2020-07-16 DIAGNOSIS — M79604 Pain in right leg: Secondary | ICD-10-CM | POA: Diagnosis not present

## 2020-07-16 DIAGNOSIS — M170 Bilateral primary osteoarthritis of knee: Secondary | ICD-10-CM

## 2020-07-16 DIAGNOSIS — M79605 Pain in left leg: Secondary | ICD-10-CM | POA: Diagnosis not present

## 2020-07-16 MED ORDER — COLCHICINE 0.6 MG PO CAPS: 1.0000 | ORAL_CAPSULE | Freq: Two times a day (BID) | ORAL | 11 refills | Status: DC | PRN

## 2020-07-16 NOTE — Assessment & Plan Note (Signed)
Darin Moran has bilateral calcium pyrophosphate deposition disease and osteoarthritis, he would like to restart colchicine. Happy to do this, if he fails we will do a steroid injection. If this fails he will need knee arthroplasty.

## 2020-07-16 NOTE — Assessment & Plan Note (Signed)
Bilateral radiating pain below the knees, initially suspected to be related to lumbar spinal stenosis, severe on a CT scan from several years ago. Gabapentin at its current dose was too strong, and he was sedated through the day. Discontinue for now, we can revisit this in the future at gabapentin 100.

## 2020-07-16 NOTE — Progress Notes (Signed)
    Procedures performed today:    None.  Independent interpretation of notes and tests performed by another provider:   None.  Brief History, Exam, Impression, and Recommendations:    Primary osteoarthritis of both knees Darin Moran has bilateral calcium pyrophosphate deposition disease and osteoarthritis, he would like to restart colchicine. Happy to do this, if he fails we will do a steroid injection. If this fails he will need knee arthroplasty.  Bilateral leg pain Bilateral radiating pain below the knees, initially suspected to be related to lumbar spinal stenosis, severe on a CT scan from several years ago. Gabapentin at its current dose was too strong, and he was sedated through the day. Discontinue for now, we can revisit this in the future at gabapentin 100.    ___________________________________________ [name]. Benjamin Stain, M.D., ABFM., CAQSM. Primary Care and Sports Medicine [facility]  Adjunct Instructor of Family Medicine  [employer]

## 2020-07-31 ENCOUNTER — Ambulatory Visit (INDEPENDENT_AMBULATORY_CARE_PROVIDER_SITE_OTHER): Payer: Medicare Other | Admitting: Sports Medicine

## 2020-07-31 ENCOUNTER — Ambulatory Visit (INDEPENDENT_AMBULATORY_CARE_PROVIDER_SITE_OTHER): Payer: Medicare Other

## 2020-07-31 ENCOUNTER — Other Ambulatory Visit: Payer: Self-pay

## 2020-07-31 ENCOUNTER — Telehealth: Payer: Self-pay

## 2020-07-31 DIAGNOSIS — M48061 Spinal stenosis, lumbar region without neurogenic claudication: Secondary | ICD-10-CM

## 2020-07-31 DIAGNOSIS — M545 Low back pain, unspecified: Secondary | ICD-10-CM | POA: Diagnosis not present

## 2020-07-31 MED ORDER — TRIAZOLAM 0.25 MG PO TABS: ORAL_TABLET | ORAL | 0 refills | Status: DC

## 2020-07-31 NOTE — Assessment & Plan Note (Signed)
Bilateral radiating pain below the knees, he did have severe spinal stenosis on the CT scan from several years ago, updated x-rays today, MRI, for epidural planning, he has failed greater than 6 weeks of conservative treatment including medications including gabapentin. He does endorse claustrophobia so we will add triazolam for preprocedural anxiolysis.

## 2020-07-31 NOTE — Progress Notes (Signed)
    Procedures performed today:    None.  Independent interpretation of notes and tests performed by another provider:   None.  Brief History, Exam, Impression, and Recommendations:    Lumbar spinal stenosis Bilateral radiating pain below the knees, he did have severe spinal stenosis on the CT scan from several years ago, updated x-rays today, MRI, for epidural planning, he has failed greater than 6 weeks of conservative treatment including medications including gabapentin. He does endorse claustrophobia so we will add triazolam for preprocedural anxiolysis.    ___________________________________________ Gwen Her. Dianah Field, M.D., ABFM., CAQSM. Primary Care and [place] Instructor of [place] of [facility] of Medicine

## 2020-07-31 NOTE — Telephone Encounter (Signed)
The triazolam you called in for patient is out of stock until some time in February. Can you please substitute something else?   Patient's son also called asking you substitute something else in place of the Halcion.

## 2020-07-31 NOTE — Telephone Encounter (Signed)
They could just go [facility] the [facility] for that specific medication.  Triazolam is rapid acting, and it should not knock him out for the entire day like other ones well.  Let me know if they are okay going [facility].

## 2020-07-31 NOTE — Addendum Note (Signed)
Addended by: Silverio Decamp on: 07/31/2020 01:50 PM   Modules accepted: Orders

## 2020-08-01 ENCOUNTER — Encounter: Payer: Self-pay | Admitting: Sports Medicine

## 2020-08-01 MED ORDER — TRIAZOLAM 0.25 MG PO TABS: ORAL_TABLET | ORAL | 0 refills | Status: DC

## 2020-08-01 NOTE — Telephone Encounter (Signed)
Spoke with the son, he states that they will use the [facility] in [place] for this prescription.

## 2020-08-01 NOTE — Telephone Encounter (Signed)
Switched to [facility] in [place].

## 2020-08-05 ENCOUNTER — Other Ambulatory Visit: Payer: Medicare Other

## 2020-08-08 DIAGNOSIS — M25762 Osteophyte, left knee: Secondary | ICD-10-CM | POA: Diagnosis not present

## 2020-08-09 DIAGNOSIS — M25761 Osteophyte, right knee: Secondary | ICD-10-CM | POA: Diagnosis not present

## 2020-08-13 ENCOUNTER — Ambulatory Visit (INDEPENDENT_AMBULATORY_CARE_PROVIDER_SITE_OTHER): Payer: Medicare Other | Admitting: Sports Medicine

## 2020-08-13 ENCOUNTER — Other Ambulatory Visit: Payer: Self-pay

## 2020-08-13 DIAGNOSIS — M4820 Kissing spine, site unspecified: Secondary | ICD-10-CM | POA: Diagnosis not present

## 2020-08-13 DIAGNOSIS — M170 Bilateral primary osteoarthritis of knee: Secondary | ICD-10-CM | POA: Diagnosis not present

## 2020-08-13 MED ORDER — COLCHICINE 0.6 MG PO CAPS: 1.0000 | ORAL_CAPSULE | Freq: Every day | ORAL | 11 refills | Status: DC | PRN

## 2020-08-13 NOTE — Assessment & Plan Note (Signed)
Jantz tells me his bilateral radiating pain below the knees has improved, he did have severe spinal stenosis on a CT scan from several years ago, ultimately he failed greater than 6 weeks of conservative treatment including medications including gabapentin. Unfortunately he did refuse to do his MRI, he tells me he is feeling a little bit better. He is profoundly claustrophobic in spite of medication so if we really do need to perform intervention into his lumbar spine we will try a CT of the lumbar spine rather than an MRI.

## 2020-08-13 NOTE — Progress Notes (Signed)
    Procedures performed today:    None.  Independent interpretation of notes and tests performed by another provider:   None.  Brief History, Exam, Impression, and Recommendations:    [place]'s syndrome with lumbar spinal stenosis Mercy tells me his bilateral radiating pain below the knees has improved, he did have severe spinal stenosis on a CT scan from several years ago, ultimately he failed greater than 6 weeks of conservative treatment including medications including gabapentin. Unfortunately he did refuse to do his MRI, he tells me he is feeling a little bit better. He is profoundly claustrophobic in spite of medication so if we really do need to perform intervention into his lumbar spine we will try a CT of the lumbar spine rather than an MRI.  Primary osteoarthritis of both knees Bilateral calcium pyrophosphate deposition is a disease with osteoarthritis, he would like a refill on colchicine but only 30 pills/month rather than 60, it sounds like he is getting Wharton's jelly and amniotic membrane injections at an outside regenerative clinic and seems to be having some improvement. We do not know his if this is placebo response, and if he would get long-term relief.     ___________________________________________ Gwen Her. Dianah Field, M.D., ABFM., CAQSM. Primary Care and [place] Instructor of [place] of [facility] of Medicine

## 2020-08-13 NOTE — Assessment & Plan Note (Signed)
Bilateral calcium pyrophosphate deposition is a disease with osteoarthritis, he would like a refill on colchicine but only 30 pills/month rather than 60, it sounds like he is getting Wharton's jelly and amniotic membrane injections at an outside regenerative clinic and seems to be having some improvement. We do not know his if this is placebo response, and if he would get long-term relief.

## 2020-08-17 DIAGNOSIS — I10 Essential (primary) hypertension: Secondary | ICD-10-CM | POA: Diagnosis not present

## 2020-08-17 DIAGNOSIS — N400 Enlarged prostate without lower urinary tract symptoms: Secondary | ICD-10-CM | POA: Diagnosis not present

## 2020-08-17 DIAGNOSIS — M179 Osteoarthritis of knee, unspecified: Secondary | ICD-10-CM | POA: Diagnosis not present

## 2020-08-17 DIAGNOSIS — D500 Iron deficiency anemia secondary to blood loss (chronic): Secondary | ICD-10-CM | POA: Diagnosis not present

## 2020-08-17 DIAGNOSIS — E1169 Type 2 diabetes mellitus with other specified complication: Secondary | ICD-10-CM | POA: Diagnosis not present

## 2020-08-17 DIAGNOSIS — N183 Chronic kidney disease, stage 3 unspecified: Secondary | ICD-10-CM | POA: Diagnosis not present

## 2020-08-17 DIAGNOSIS — E782 Mixed hyperlipidemia: Secondary | ICD-10-CM | POA: Diagnosis not present

## 2020-09-13 DIAGNOSIS — D500 Iron deficiency anemia secondary to blood loss (chronic): Secondary | ICD-10-CM | POA: Diagnosis not present

## 2020-09-13 DIAGNOSIS — Z0000 Encounter for general adult medical examination without abnormal findings: Secondary | ICD-10-CM | POA: Diagnosis not present

## 2020-09-13 DIAGNOSIS — H9190 Unspecified hearing loss, unspecified ear: Secondary | ICD-10-CM | POA: Diagnosis not present

## 2020-09-13 DIAGNOSIS — E1169 Type 2 diabetes mellitus with other specified complication: Secondary | ICD-10-CM | POA: Diagnosis not present

## 2020-09-13 DIAGNOSIS — N400 Enlarged prostate without lower urinary tract symptoms: Secondary | ICD-10-CM | POA: Diagnosis not present

## 2020-09-13 DIAGNOSIS — E782 Mixed hyperlipidemia: Secondary | ICD-10-CM | POA: Diagnosis not present

## 2020-09-13 DIAGNOSIS — I10 Essential (primary) hypertension: Secondary | ICD-10-CM | POA: Diagnosis not present

## 2020-09-13 DIAGNOSIS — N183 Chronic kidney disease, stage 3 unspecified: Secondary | ICD-10-CM | POA: Diagnosis not present

## 2020-09-13 DIAGNOSIS — M2550 Pain in unspecified joint: Secondary | ICD-10-CM | POA: Diagnosis not present

## 2020-10-04 DIAGNOSIS — E782 Mixed hyperlipidemia: Secondary | ICD-10-CM | POA: Diagnosis not present

## 2020-10-04 DIAGNOSIS — N400 Enlarged prostate without lower urinary tract symptoms: Secondary | ICD-10-CM | POA: Diagnosis not present

## 2020-10-04 DIAGNOSIS — E1169 Type 2 diabetes mellitus with other specified complication: Secondary | ICD-10-CM | POA: Diagnosis not present

## 2020-10-04 DIAGNOSIS — I10 Essential (primary) hypertension: Secondary | ICD-10-CM | POA: Diagnosis not present

## 2020-10-04 DIAGNOSIS — M179 Osteoarthritis of knee, unspecified: Secondary | ICD-10-CM | POA: Diagnosis not present

## 2020-10-04 DIAGNOSIS — N183 Chronic kidney disease, stage 3 unspecified: Secondary | ICD-10-CM | POA: Diagnosis not present

## 2020-10-04 DIAGNOSIS — D500 Iron deficiency anemia secondary to blood loss (chronic): Secondary | ICD-10-CM | POA: Diagnosis not present

## 2020-10-24 DIAGNOSIS — M1712 Unilateral primary osteoarthritis, left knee: Secondary | ICD-10-CM | POA: Diagnosis not present

## 2020-10-25 DIAGNOSIS — M1711 Unilateral primary osteoarthritis, right knee: Secondary | ICD-10-CM | POA: Diagnosis not present

## 2020-10-29 ENCOUNTER — Ambulatory Visit (INDEPENDENT_AMBULATORY_CARE_PROVIDER_SITE_OTHER): Payer: Medicare Other | Admitting: Sports Medicine

## 2020-10-29 ENCOUNTER — Other Ambulatory Visit: Payer: Self-pay

## 2020-10-29 DIAGNOSIS — R600 Localized edema: Secondary | ICD-10-CM | POA: Insufficient documentation

## 2020-10-29 NOTE — Progress Notes (Addendum)
    Procedures performed today:    None.  Independent interpretation of notes and tests performed by another provider:   None.  Brief History, Exam, Impression, and Recommendations:    Bilateral lower extremity edema Darin Moran has bilateral lower extremity pitting edema, no orthopnea or PND. He does not have any knee pain with the current medication regimen. He tells me he tried to get in with his primary care provider and could not get a hold of them and presented here for help. I did advise him that this is outside of my specialty, and he really needs to either find a new primary care provider or get in with his PCP for further treatment.  I spoke with his office, they will send a message to Dr. Harrington Challenger to get him worked in.    ___________________________________________ Gwen Her. Dianah Field, M.D., ABFM., CAQSM. Primary Care and [place] Instructor of [place] of [facility] of Medicine

## 2020-10-29 NOTE — Assessment & Plan Note (Addendum)
Darin Moran has bilateral lower extremity pitting edema, no orthopnea or PND. He does not have any knee pain with the current medication regimen. He tells me he tried to get in with his primary care provider and could not get a hold of them and presented here for help. I did advise him that this is outside of my specialty, and he really needs to either find a new primary care provider or get in with his PCP for further treatment.  I spoke with his office, they will send a message to Dr. Harrington Challenger to get him worked in.

## 2020-10-30 DIAGNOSIS — R600 Localized edema: Secondary | ICD-10-CM | POA: Diagnosis not present

## 2020-10-30 DIAGNOSIS — N183 Chronic kidney disease, stage 3 unspecified: Secondary | ICD-10-CM | POA: Diagnosis not present

## 2020-10-30 DIAGNOSIS — M7989 Other specified soft tissue disorders: Secondary | ICD-10-CM | POA: Diagnosis not present

## 2020-10-31 ENCOUNTER — Telehealth: Payer: Self-pay | Admitting: Cardiology

## 2020-10-31 DIAGNOSIS — M1712 Unilateral primary osteoarthritis, left knee: Secondary | ICD-10-CM | POA: Diagnosis not present

## 2020-10-31 DIAGNOSIS — I10 Essential (primary) hypertension: Secondary | ICD-10-CM | POA: Insufficient documentation

## 2020-10-31 DIAGNOSIS — M1990 Unspecified osteoarthritis, unspecified site: Secondary | ICD-10-CM | POA: Insufficient documentation

## 2020-10-31 DIAGNOSIS — E119 Type 2 diabetes mellitus without complications: Secondary | ICD-10-CM | POA: Insufficient documentation

## 2020-10-31 NOTE — Telephone Encounter (Signed)
Melissa from [facility], the patient's PA, is requesting to speak with the DOD in regards to lab work they just received. Phone: [phone]

## 2020-11-01 ENCOUNTER — Ambulatory Visit (INDEPENDENT_AMBULATORY_CARE_PROVIDER_SITE_OTHER): Payer: Medicare Other

## 2020-11-01 ENCOUNTER — Ambulatory Visit (INDEPENDENT_AMBULATORY_CARE_PROVIDER_SITE_OTHER): Payer: Medicare Other | Admitting: Cardiology

## 2020-11-01 ENCOUNTER — Other Ambulatory Visit: Payer: Self-pay

## 2020-11-01 VITALS — BP 150/70 | HR 91 | Ht 69.0 in | Wt 161.0 lb

## 2020-11-01 DIAGNOSIS — I493 Ventricular premature depolarization: Secondary | ICD-10-CM | POA: Diagnosis not present

## 2020-11-01 DIAGNOSIS — R011 Cardiac murmur, unspecified: Secondary | ICD-10-CM | POA: Diagnosis not present

## 2020-11-01 DIAGNOSIS — I10 Essential (primary) hypertension: Secondary | ICD-10-CM

## 2020-11-01 DIAGNOSIS — M1711 Unilateral primary osteoarthritis, right knee: Secondary | ICD-10-CM | POA: Diagnosis not present

## 2020-11-01 DIAGNOSIS — R600 Localized edema: Secondary | ICD-10-CM | POA: Insufficient documentation

## 2020-11-01 MED ORDER — FUROSEMIDE 40 MG PO TABS: ORAL_TABLET | ORAL | 3 refills | Status: DC

## 2020-11-01 MED ORDER — POTASSIUM CHLORIDE CRYS ER 20 MEQ PO TBCR: EXTENDED_RELEASE_TABLET | ORAL | 3 refills | Status: DC

## 2020-11-01 NOTE — Progress Notes (Addendum)
Cardiology Office Note:    Date:  11/01/2020   ID:  Darin Moran, DOB 1926/11/17, MRN [id number]  PCP:  Lawerance Cruel, MD  Cardiologist:  Berniece Salines, DO  Electrophysiologist:  None   Referring MD: Lawerance Cruel, MD   I am having a lot a leg swelling  History of Present Illness:    Darin Moran is a 87 y.o. male with a hx of diabetes mellitus, hypertension is here today due to significant shortness of breath.  Patient his wife tells me over the last several weeks he has been experiencing significant leg edema.  He did see his PCP recently he started on furosemide 40 mg daily.  Since he has been started on this medication he has not seen significant change.  He denies any chest pain.   Past Medical History:  Diagnosis Date  . Arthritis   . Diabetes mellitus without complication ([place])   . Hypertension     Past Surgical History:  Procedure Laterality Date  . CATARACT EXTRACTION W/PHACO Left 11/21/2019   Procedure: CATARACT EXTRACTION PHACO AND INTRAOCULAR LENS PLACEMENT (IOC) CDE:  15.38;  Surgeon: Baruch Goldmann, MD;  Location: [facility];  Service: Ophthalmology;  Laterality: Left;  . CATARACT EXTRACTION W/PHACO Right 12/05/2019   Procedure: CATARACT EXTRACTION PHACO AND INTRAOCULAR LENS PLACEMENT RIGHT EYE;  Surgeon: Baruch Goldmann, MD;  Location: [facility];  Service: Ophthalmology;  Laterality: Right;  CDE: 14.04    Current Medications: Current Meds  Medication Sig  . acetaminophen (TYLENOL) 650 MG CR tablet Take 2 tablets (1,300 mg total) by mouth at bedtime. (Patient taking differently: Take 650-1,300 mg by mouth every 8 (eight) hours as needed (pain.).)  . celecoxib (CELEBREX) 200 MG capsule One to 2 tablets by mouth daily as needed for pain.  . cholecalciferol (VITAMIN D) 25 MCG (1000 UNIT) tablet Take 1,000 Units by mouth daily.  . Colchicine (MITIGARE) 0.6 MG CAPS Take 1 capsule by mouth daily as needed.  Water engineer Bandages & Supports (MEDICAL COMPRESSION THIGH  HIGH) MISC 30 or greater mmHg thigh-high compression socks  . furosemide (LASIX) 40 MG tablet Take 40 mg twice daily for 7 days then 40 mg once daily  . metFORMIN (GLUCOPHAGE) 1000 MG tablet Take 1,000 mg by mouth 2 (two) times daily with a meal.  . Multiple Vitamin (MULTIVITAMIN WITH MINERALS) TABS tablet Take 1 tablet by mouth daily.  . Naphazoline-Pheniramine 0.027-0.315 % SOLN Place 1 drop into both eyes 3 (three) times daily as needed (dry/irritated/allergy eye relief).  Marland Kitchen oxymetazoline (AFRIN) 0.05 % nasal spray Place 1 spray into both nostrils at bedtime as needed for congestion.  . pioglitazone (ACTOS) 45 MG tablet Take 45 mg by mouth daily.  . potassium chloride SA (KLOR-CON M20) 20 MEQ tablet Take 20 meq twice daily for 7 days then 20 meq once daily  . pravastatin (PRAVACHOL) 20 MG tablet Take 20 mg by mouth once a week.  . quinapril (ACCUPRIL) 10 MG tablet Take 10 mg by mouth daily.  . tamsulosin (FLOMAX) 0.4 MG CAPS capsule Take 0.4 mg by mouth daily.  . triazolam (HALCION) 0.25 MG tablet 1-2 tabs PO 2 hours before procedure or imaging.  Do not drive with this medication.  . [DISCONTINUED] hydrochlorothiazide (HYDRODIURIL) 25 MG tablet Take 25 mg by mouth daily.  . [DISCONTINUED] Multiple Vitamins-Minerals (ICAPS PO) Take 1 tablet by mouth daily.     Allergies:   Nsaids   Social History   Socioeconomic History  . Marital status: Married  Spouse name: Not on file  . Number of children: Not on file  . Years of education: Not on file  . Highest education level: Not on file  Occupational History  . Not on file  Tobacco Use  . Smoking status: Former Research scientist (life sciences)  . Smokeless tobacco: Never Used  Substance and Sexual Activity  . Alcohol use: Never  . Drug use: Never  . Sexual activity: Not Currently  Other Topics Concern  . Not on file  Social History Narrative  . Not on file   Social Determinants of Health   Financial Resource Strain: Not on file  Food Insecurity: Not on  file  Transportation Needs: Not on file  Physical Activity: Not on file  Stress: Not on file  Social Connections: Not on file     Family History: The patient's family history includes Cancer in his father.  ROS:   Review of Systems  Constitution: Negative for decreased appetite, fever and weight gain.  HENT: Negative for congestion, ear discharge, hoarse voice and sore throat.   Eyes: Negative for discharge, redness, vision loss in right eye and visual halos.  Cardiovascular: Negative for chest pain, dyspnea on exertion, leg swelling, orthopnea and palpitations.  Respiratory: Negative for cough, hemoptysis, shortness of breath and snoring.   Endocrine: Negative for heat intolerance and polyphagia.  Hematologic/Lymphatic: Negative for bleeding problem. Does not bruise/bleed easily.  Skin: Negative for flushing, nail changes, rash and suspicious lesions.  Musculoskeletal: Negative for arthritis, joint pain, muscle cramps, myalgias, neck pain and stiffness.  Gastrointestinal: Negative for abdominal pain, bowel incontinence, diarrhea and excessive appetite.  Genitourinary: Negative for decreased libido, genital sores and incomplete emptying.  Neurological: Negative for brief paralysis, focal weakness, headaches and loss of balance.  Psychiatric/Behavioral: Negative for altered mental status, depression and suicidal ideas.  Allergic/Immunologic: Negative for HIV exposure and persistent infections.    EKGs/Labs/Other Studies Reviewed:    The following studies were reviewed today:   EKG:  The ekg ordered today demonstrates sinus rhythm, heart rate 91 bpm with frequent PVCs.  Recent Labs: 11/18/2019: BUN 38; Creatinine, Ser 1.25; Potassium 5.0; Sodium 138  Recent Lipid Panel No results found for: CHOL, TRIG, HDL, CHOLHDL, VLDL, LDLCALC, LDLDIRECT  Physical Exam:    VS:  BP (!) 150/70 (BP Location: Right Arm)   Pulse 91   Ht 5\' 9"  (1.753 m)   Wt 161 lb 0.6 oz (73 kg)   SpO2 95%    BMI 23.78 kg/m     Wt Readings from Last 3 Encounters:  11/01/20 161 lb 0.6 oz (73 kg)  12/05/19 152 lb (68.9 kg)  11/18/19 152 lb (68.9 kg)     GEN: Well nourished, well developed in no acute distress HEENT: Normal NECK: No JVD; No carotid bruits LYMPHATICS: No lymphadenopathy CARDIAC: S1S2 noted,RRR, 2/6 mid ejection systolic murmurs, rubs, gallops RESPIRATORY:  Clear to auscultation without rales, wheezing or rhonchi  ABDOMEN: Soft, non-tender, non-distended, +bowel sounds, no guarding. EXTREMITIES: +3 bilateral edema, No cyanosis, no clubbing MUSCULOSKELETAL:  No deformity  SKIN: Warm and dry NEUROLOGIC:  Alert and oriented x 3, non-focal PSYCHIATRIC:  Normal affect, good insight  ASSESSMENT:    1. Bilateral leg edema   2. Murmur, cardiac   3. PVC (premature ventricular contraction)   4. Hypertension, unspecified type    PLAN:    Increase his furosemide from 40 mg daily to 40 mg twice a day for the next 7 days and then he will go back to 40 mg  daily.  With potassium supplement.   Echocardiogram will also be done to assess LV/RV function and any other structure abnormalities. He has some frequent PVCs on his EKG today, I would like to monitor and patient understand the PVC burden. His blood pressure is elevated in the office today but hopefully with the optimizing his diuretics which can help.  The patient is in agreement with the above plan. The patient left the office in stable condition.  The patient will follow up in 4 weeks or sooner if needed   Medication Adjustments/Labs and Tests Ordered: Current medicines are reviewed at length with the patient today.  Concerns regarding medicines are outlined above.  Orders Placed This Encounter  Procedures  . LONG TERM MONITOR (3-14 DAYS)  . EKG 12-Lead  . ECHOCARDIOGRAM COMPLETE   Meds ordered this encounter  Medications  . furosemide (LASIX) 40 MG tablet    Sig: Take 40 mg twice daily for 7 days then 40 mg once daily     Dispense:  90 tablet    Refill:  3  . potassium chloride SA (KLOR-CON M20) 20 MEQ tablet    Sig: Take 20 meq twice daily for 7 days then 20 meq once daily    Dispense:  90 tablet    Refill:  3    Patient Instructions   Medication Instructions:  Your physician has recommended you make the following change in your medication:  START: Lasix 40 mg twice daily for 7 days then 40 mg once daily START: Potassium 20 mg twice daily for 7 days then once daily  *If you need a refill on your cardiac medications before your next appointment, please call your pharmacy*   Lab Work: None If you have labs (blood work) drawn today and your tests are completely normal, you will receive your results only by: Marland Kitchen MyChart Message (if you have MyChart) OR . A paper copy in the mail If you have any lab test that is abnormal or we need to change your treatment, we will call you to review the results.   Testing/Procedures: Your physician has requested that you have an echocardiogram. Echocardiography is a painless test that uses sound waves to create images of your heart. It provides your doctor with information about the size and shape of your heart and how well your heart's chambers and valves are working. This procedure takes approximately one hour. There are no restrictions for this procedure.  A zio monitor was ordered today. It will remain on for 3 days. You will then return monitor and event diary in provided box. It takes 1-2 weeks for report to be downloaded and returned to [place]. We will call you with the results. If monitor falls off or has orange flashing light, please call Zio for further instructions.    Follow-Up: At [facility], you and your health needs are our priority.  As part of our continuing mission to provide you with exceptional heart care, we have created designated Provider Care Teams.  These Care Teams include your primary Cardiologist (physician) and Advanced Practice Providers  (APPs -  Physician Assistants and Nurse Practitioners) who all work together to provide you with the care you need, when you need it.  We recommend signing up for the patient portal called "MyChart".  Sign up information is provided on this After Visit Summary.  MyChart is used to connect with patients for Virtual Visits (Telemedicine).  Patients are able to view lab/test results, encounter notes, upcoming appointments, etc.  Non-urgent messages can be sent to your provider as well.   To learn more about what you can do with MyChart, go to [email or web address].    Your next appointment:   4 week(s)  The format for your next appointment:   In Person  Provider:   Berniece Salines, DO   Other Instructions  Echocardiogram An echocardiogram is a test that uses sound waves (ultrasound) to produce images of the heart. Images from an echocardiogram can provide important information about:  Heart size and shape.  The size and thickness and movement of your heart's walls.  Heart muscle function and strength.  Heart valve function or if you have stenosis. Stenosis is when the heart valves are too narrow.  If blood is flowing backward through the heart valves (regurgitation).  A tumor or infectious growth around the heart valves.  Areas of heart muscle that are not working well because of poor blood flow or injury from a heart attack.  Aneurysm detection. An aneurysm is a weak or damaged part of an artery wall. The wall bulges out from the normal force of blood pumping through the body. Tell a health care provider about:  Any allergies you have.  All medicines you are taking, including vitamins, herbs, eye drops, creams, and over-the-counter medicines.  Any blood disorders you have.  Any surgeries you have had.  Any medical conditions you have.  Whether you are pregnant or may be pregnant. What are the risks? Generally, this is a safe test. However, problems may occur, including  an allergic reaction to dye (contrast) that may be used during the test. What happens before the test? No specific preparation is needed. You may eat and drink normally. What happens during the test?  You will take off your clothes from the waist up and put on a hospital gown.  Electrodes or electrocardiogram (ECG)patches may be placed on your chest. The electrodes or patches are then connected to a device that monitors your heart rate and rhythm.  You will lie down on a table for an ultrasound exam. A gel will be applied to your chest to help sound waves pass through your skin.  A handheld device, called a transducer, will be pressed against your chest and moved over your heart. The transducer produces sound waves that travel to your heart and bounce back (or "echo" back) to the transducer. These sound waves will be captured in real-time and changed into images of your heart that can be viewed on a video monitor. The images will be recorded on a computer and reviewed by your health care provider.  You may be asked to change positions or hold your breath for a short time. This makes it easier to get different views or better views of your heart.  In some cases, you may receive contrast through an IV in one of your veins. This can improve the quality of the pictures from your heart. The procedure may vary among health care providers and hospitals.   What can I expect after the test? You may return to your normal, everyday life, including diet, activities, and medicines, unless your health care provider tells you not to do that. Follow these instructions at home:  It is up to you to get the results of your test. Ask your health care provider, or the department that is doing the test, when your results will be ready.  Keep all follow-up visits. This is important. Summary  An echocardiogram is a test that uses  sound waves (ultrasound) to produce images of the heart.  Images from an  echocardiogram can provide important information about the size and shape of your heart, heart muscle function, heart valve function, and other possible heart problems.  You do not need to do anything to prepare before this test. You may eat and drink normally.  After the echocardiogram is completed, you may return to your normal, everyday life, unless your health care provider tells you not to do that. This information is not intended to replace advice given to you by your health care provider. Make sure you discuss any questions you have with your health care provider. Document Revised: 02/21/2020 Document Reviewed: 02/21/2020 Elsevier Patient Education  2021 [place] DOCTOR PRESCRIBING ZIO? The Zio system is proven and trusted by physicians to detect and diagnose irregular heart rhythms -- and has been prescribed to hundreds of thousands of patients.  The FDA has cleared the Zio system to monitor for many different kinds of irregular heart rhythms. In a study, physicians were able to reach a diagnosis 90% of the time with the Zio system1.  You can wear the Zio monitor -- a small, discreet, comfortable patch -- during your normal day-to-day activity, including while you sleep, shower, and exercise, while it records every single heartbeat for analysis.  1Barrett, P., et al. Comparison of 24 Hour Holter Monitoring Versus 14 Day Novel Adhesive Patch Electrocardiographic Monitoring. [place], 2014.  ZIO VS. HOLTER MONITORING The Zio monitor can be comfortably worn for up to 14 days. Holter monitors can be worn for 24 to 48 hours, limiting the time to record any irregular heart rhythms you may have. Zio is able to capture data for the 51% of patients who have their first symptom-triggered arrhythmia after 48 hours.1  LIVE WITHOUT RESTRICTIONS The Zio ambulatory cardiac monitor is a small, unobtrusive, and water-resistant patch--you might even forget  you're wearing it. The Zio monitor records and stores every beat of your heart, whether you're sleeping, working out, or showering. Remove on:  April 24th, 2022     Adopting a Healthy Lifestyle.  Know what a healthy weight is for you (roughly BMI <25) and aim to maintain this   Aim for 7+ servings of fruits and vegetables daily   65-80+ fluid ounces of water or unsweet tea for healthy kidneys   Limit to max 1 drink of alcohol per day; avoid smoking/tobacco   Limit animal fats in diet for cholesterol and heart health - choose grass fed whenever available   Avoid highly processed foods, and foods high in saturated/trans fats   Aim for low stress - take time to unwind and care for your mental health   Aim for 150 min of moderate intensity exercise weekly for heart health, and weights twice weekly for bone health   Aim for 7-9 hours of sleep daily   When it comes to diets, agreement about the perfect plan isnt easy to find, even among the experts. Experts at the [employer] developed an idea known as the Healthy Eating Plate. Just imagine a plate divided into logical, healthy portions.   The emphasis is on diet quality:   Load up on vegetables and fruits - one-half of your plate: Aim for color and variety, and remember that potatoes dont count.   Go for whole grains - one-quarter of your plate: Whole wheat, barley, wheat berries, quinoa, oats, brown rice, and foods made  with them. If you want pasta, go with whole wheat pasta.   Protein power - one-quarter of your plate: Fish, chicken, beans, and nuts are all healthy, versatile protein sources. Limit red meat.   The diet, however, does go beyond the plate, offering a few other suggestions.   Use healthy plant oils, such as olive, canola, soy, corn, sunflower and peanut. Check the labels, and avoid partially hydrogenated oil, which have unhealthy trans fats.   If youre thirsty, drink water. Coffee and tea are  good in moderation, but skip sugary drinks and limit milk and dairy products to one or two daily servings.   The type of carbohydrate in the diet is more important than the amount. Some sources of carbohydrates, such as vegetables, fruits, whole grains, and beans-are healthier than others.   Finally, stay active  Signed, Berniece Salines, DO  11/01/2020 5:50 PM    [facility]

## 2020-11-01 NOTE — Patient Instructions (Addendum)
Medication Instructions:  Your physician has recommended you make the following change in your medication:  START: Lasix 40 mg twice daily for 7 days then 40 mg once daily START: Potassium 20 mg twice daily for 7 days then once daily  *If you need a refill on your cardiac medications before your next appointment, please call your pharmacy*   Lab Work: None If you have labs (blood work) drawn today and your tests are completely normal, you will receive your results only by: Marland Kitchen MyChart Message (if you have MyChart) OR . A paper copy in the mail If you have any lab test that is abnormal or we need to change your treatment, we will call you to review the results.   Testing/Procedures: Your physician has requested that you have an echocardiogram. Echocardiography is a painless test that uses sound waves to create images of your heart. It provides your doctor with information about the size and shape of your heart and how well your heart's chambers and valves are working. This procedure takes approximately one hour. There are no restrictions for this procedure.  A zio monitor was ordered today. It will remain on for 3 days. You will then return monitor and event diary in provided box. It takes 1-2 weeks for report to be downloaded and returned to [place]. We will call you with the results. If monitor falls off or has orange flashing light, please call Zio for further instructions.    Follow-Up: At [facility], you and your health needs are our priority.  As part of our continuing mission to provide you with exceptional heart care, we have created designated Provider Care Teams.  These Care Teams include your primary Cardiologist (physician) and Advanced Practice Providers (APPs -  Physician Assistants and Nurse Practitioners) who all work together to provide you with the care you need, when you need it.  We recommend signing up for the patient portal called "MyChart".  Sign up information is provided  on this After Visit Summary.  MyChart is used to connect with patients for Virtual Visits (Telemedicine).  Patients are able to view lab/test results, encounter notes, upcoming appointments, etc.  Non-urgent messages can be sent to your provider as well.   To learn more about what you can do with MyChart, go to [email or web address].    Your next appointment:   4 week(s)  The format for your next appointment:   In Person  Provider:   Berniece Salines, DO   Other Instructions  Echocardiogram An echocardiogram is a test that uses sound waves (ultrasound) to produce images of the heart. Images from an echocardiogram can provide important information about:  Heart size and shape.  The size and thickness and movement of your heart's walls.  Heart muscle function and strength.  Heart valve function or if you have stenosis. Stenosis is when the heart valves are too narrow.  If blood is flowing backward through the heart valves (regurgitation).  A tumor or infectious growth around the heart valves.  Areas of heart muscle that are not working well because of poor blood flow or injury from a heart attack.  Aneurysm detection. An aneurysm is a weak or damaged part of an artery wall. The wall bulges out from the normal force of blood pumping through the body. Tell a health care provider about:  Any allergies you have.  All medicines you are taking, including vitamins, herbs, eye drops, creams, and over-the-counter medicines.  Any blood disorders you have.  Any  surgeries you have had.  Any medical conditions you have.  Whether you are pregnant or may be pregnant. What are the risks? Generally, this is a safe test. However, problems may occur, including an allergic reaction to dye (contrast) that may be used during the test. What happens before the test? No specific preparation is needed. You may eat and drink normally. What happens during the test?  You will take off your clothes  from the waist up and put on a hospital gown.  Electrodes or electrocardiogram (ECG)patches may be placed on your chest. The electrodes or patches are then connected to a device that monitors your heart rate and rhythm.  You will lie down on a table for an ultrasound exam. A gel will be applied to your chest to help sound waves pass through your skin.  A handheld device, called a transducer, will be pressed against your chest and moved over your heart. The transducer produces sound waves that travel to your heart and bounce back (or "echo" back) to the transducer. These sound waves will be captured in real-time and changed into images of your heart that can be viewed on a video monitor. The images will be recorded on a computer and reviewed by your health care provider.  You may be asked to change positions or hold your breath for a short time. This makes it easier to get different views or better views of your heart.  In some cases, you may receive contrast through an IV in one of your veins. This can improve the quality of the pictures from your heart. The procedure may vary among health care providers and hospitals.   What can I expect after the test? You may return to your normal, everyday life, including diet, activities, and medicines, unless your health care provider tells you not to do that. Follow these instructions at home:  It is up to you to get the results of your test. Ask your health care provider, or the department that is doing the test, when your results will be ready.  Keep all follow-up visits. This is important. Summary  An echocardiogram is a test that uses sound waves (ultrasound) to produce images of the heart.  Images from an echocardiogram can provide important information about the size and shape of your heart, heart muscle function, heart valve function, and other possible heart problems.  You do not need to do anything to prepare before this test. You may eat and  drink normally.  After the echocardiogram is completed, you may return to your normal, everyday life, unless your health care provider tells you not to do that. This information is not intended to replace advice given to you by your health care provider. Make sure you discuss any questions you have with your health care provider. Document Revised: 02/21/2020 Document Reviewed: 02/21/2020 Elsevier Patient Education  2021 [place] DOCTOR PRESCRIBING ZIO? The Zio system is proven and trusted by physicians to detect and diagnose irregular heart rhythms -- and has been prescribed to hundreds of thousands of patients.  The FDA has cleared the Zio system to monitor for many different kinds of irregular heart rhythms. In a study, physicians were able to reach a diagnosis 90% of the time with the Zio system1.  You can wear the Zio monitor -- a small, discreet, comfortable patch -- during your normal day-to-day activity, including while you sleep, shower, and exercise, while it records every single heartbeat for  analysis.  1Barrett, P., et al. Comparison of 24 Hour Holter Monitoring Versus 14 Day Novel Adhesive Patch Electrocardiographic Monitoring. [place], 2014.  ZIO VS. HOLTER MONITORING The Zio monitor can be comfortably worn for up to 14 days. Holter monitors can be worn for 24 to 48 hours, limiting the time to record any irregular heart rhythms you may have. Zio is able to capture data for the 51% of patients who have their first symptom-triggered arrhythmia after 48 hours.1  LIVE WITHOUT RESTRICTIONS The Zio ambulatory cardiac monitor is a small, unobtrusive, and water-resistant patch--you might even forget you're wearing it. The Zio monitor records and stores every beat of your heart, whether you're sleeping, working out, or showering. Remove on:  April 24th, 2022

## 2020-11-04 DIAGNOSIS — I493 Ventricular premature depolarization: Secondary | ICD-10-CM

## 2020-11-06 DIAGNOSIS — J309 Allergic rhinitis, unspecified: Secondary | ICD-10-CM | POA: Diagnosis not present

## 2020-11-06 DIAGNOSIS — R609 Edema, unspecified: Secondary | ICD-10-CM | POA: Diagnosis not present

## 2020-11-06 DIAGNOSIS — Z79899 Other long term (current) drug therapy: Secondary | ICD-10-CM | POA: Diagnosis not present

## 2020-11-07 DIAGNOSIS — M1711 Unilateral primary osteoarthritis, right knee: Secondary | ICD-10-CM | POA: Diagnosis not present

## 2020-11-08 DIAGNOSIS — M1711 Unilateral primary osteoarthritis, right knee: Secondary | ICD-10-CM | POA: Diagnosis not present

## 2020-11-08 DIAGNOSIS — I493 Ventricular premature depolarization: Secondary | ICD-10-CM | POA: Diagnosis not present

## 2020-11-14 DIAGNOSIS — M1712 Unilateral primary osteoarthritis, left knee: Secondary | ICD-10-CM | POA: Diagnosis not present

## 2020-11-15 DIAGNOSIS — M1711 Unilateral primary osteoarthritis, right knee: Secondary | ICD-10-CM | POA: Diagnosis not present

## 2020-11-21 DIAGNOSIS — M1711 Unilateral primary osteoarthritis, right knee: Secondary | ICD-10-CM | POA: Diagnosis not present

## 2020-11-22 DIAGNOSIS — M1711 Unilateral primary osteoarthritis, right knee: Secondary | ICD-10-CM | POA: Diagnosis not present

## 2020-11-28 DIAGNOSIS — M1711 Unilateral primary osteoarthritis, right knee: Secondary | ICD-10-CM | POA: Diagnosis not present

## 2020-11-28 DIAGNOSIS — M25561 Pain in right knee: Secondary | ICD-10-CM | POA: Diagnosis not present

## 2020-11-28 DIAGNOSIS — M67361 Transient synovitis, right knee: Secondary | ICD-10-CM | POA: Diagnosis not present

## 2020-11-28 DIAGNOSIS — M25761 Osteophyte, right knee: Secondary | ICD-10-CM | POA: Diagnosis not present

## 2020-11-29 ENCOUNTER — Other Ambulatory Visit: Payer: Self-pay

## 2020-11-29 ENCOUNTER — Ambulatory Visit (HOSPITAL_COMMUNITY): Payer: Medicare Other | Attending: Cardiology

## 2020-11-29 DIAGNOSIS — R600 Localized edema: Secondary | ICD-10-CM

## 2020-11-29 LAB — ECHOCARDIOGRAM COMPLETE
Area-P 1/2: 3.72 cm2
MV VTI: 2.47 cm2
S' Lateral: 3.1 cm

## 2020-12-04 ENCOUNTER — Encounter: Payer: Self-pay | Admitting: Cardiology

## 2020-12-04 ENCOUNTER — Other Ambulatory Visit: Payer: Self-pay

## 2020-12-04 ENCOUNTER — Ambulatory Visit (INDEPENDENT_AMBULATORY_CARE_PROVIDER_SITE_OTHER): Payer: Medicare Other | Admitting: Cardiology

## 2020-12-04 VITALS — BP 138/68 | HR 84 | Ht 69.0 in | Wt 158.0 lb

## 2020-12-04 DIAGNOSIS — I493 Ventricular premature depolarization: Secondary | ICD-10-CM | POA: Diagnosis not present

## 2020-12-04 DIAGNOSIS — I5032 Chronic diastolic (congestive) heart failure: Secondary | ICD-10-CM | POA: Diagnosis not present

## 2020-12-04 DIAGNOSIS — I471 Supraventricular tachycardia: Secondary | ICD-10-CM

## 2020-12-04 DIAGNOSIS — E1100 Type 2 diabetes mellitus with hyperosmolarity without nonketotic hyperglycemic-hyperosmolar coma (NKHHC): Secondary | ICD-10-CM | POA: Diagnosis not present

## 2020-12-04 DIAGNOSIS — R600 Localized edema: Secondary | ICD-10-CM

## 2020-12-04 MED ORDER — FUROSEMIDE 40 MG PO TABS: 40.0000 mg | ORAL_TABLET | Freq: Every day | ORAL | 3 refills | Status: DC

## 2020-12-04 MED ORDER — METOPROLOL SUCCINATE ER 25 MG PO TB24: 12.5000 mg | ORAL_TABLET | Freq: Every day | ORAL | 3 refills | Status: DC

## 2020-12-04 NOTE — Patient Instructions (Signed)
Medication Instructions:  Your physician has recommended you make the following change in your medication:  START: Lasix 40 mg once daily on Tuesday and Friday START: Toprol-XL 12.5 mg nightly *If you need a refill on your cardiac medications before your next appointment, please call your pharmacy*   Lab Work: Your physician recommends that you return for lab work: TODAY: BMET, [place] If you have labs (blood work) drawn today and your tests are completely normal, you will receive your results only by: Marland Kitchen MyChart Message (if you have MyChart) OR . A paper copy in the mail If you have any lab test that is abnormal or we need to change your treatment, we will call you to review the results.   Testing/Procedures: None   Follow-Up: At [facility], you and your health needs are our priority.  As part of our continuing mission to provide you with exceptional heart care, we have created designated Provider Care Teams.  These Care Teams include your primary Cardiologist (physician) and Advanced Practice Providers (APPs -  Physician Assistants and Nurse Practitioners) who all work together to provide you with the care you need, when you need it.  We recommend signing up for the patient portal called "MyChart".  Sign up information is provided on this After Visit Summary.  MyChart is used to connect with patients for Virtual Visits (Telemedicine).  Patients are able to view lab/test results, encounter notes, upcoming appointments, etc.  Non-urgent messages can be sent to your provider as well.   To learn more about what you can do with MyChart, go to [email or web address].    Your next appointment:   6 month(s)  The format for your next appointment:   In Person  Provider:   Berniece Salines, DO   Other Instructions

## 2020-12-04 NOTE — Progress Notes (Signed)
Cardiology Office Note:    Date:  12/04/2020   ID:  Darin Moran, DOB 11-Jul-1927, MRN [id number]  PCP:  Lawerance Cruel, MD  Cardiologist:  Berniece Salines, DO  Electrophysiologist:  None   Referring MD: Lawerance Cruel, MD   Chief Complaint  Patient presents with  . Follow-up    History of Present Illness:    Darin Moran is a 87 y.o. male with a hx of diabetes mellitus, hypertension, frequent PVCs, atrial tachycardia and occasional PACs seen on ZIO monitor, chronic diastolic heart failure is here today for follow-up visit.  I first saw the patient on November 01, 2020 at that time he presented to establish cardiac care.  During that visit he had some PVCs on his EKG we placed a ZIO monitor on the patient to assess his PVC burden.  At that time he also has significant bilateral leg edema I asked the patient to take his Lasix 40 mg twice a day for 7 days and go back to taking his Lasix 40 mg daily.  Today he tells me that he had stopped taking his Lasix given the fact that his PCP told him that he can take his Lasix when he weighed himself.  Patient has not been weighing himself he does have significant leg edema today.  He denies any shortness of breath.  Past Medical History:  Diagnosis Date  . Arthritis   . Diabetes mellitus without complication ([place])   . Hypertension     Past Surgical History:  Procedure Laterality Date  . CATARACT EXTRACTION W/PHACO Left 11/21/2019   Procedure: CATARACT EXTRACTION PHACO AND INTRAOCULAR LENS PLACEMENT (IOC) CDE:  15.38;  Surgeon: Baruch Goldmann, MD;  Location: [facility];  Service: Ophthalmology;  Laterality: Left;  . CATARACT EXTRACTION W/PHACO Right 12/05/2019   Procedure: CATARACT EXTRACTION PHACO AND INTRAOCULAR LENS PLACEMENT RIGHT EYE;  Surgeon: Baruch Goldmann, MD;  Location: [facility];  Service: Ophthalmology;  Laterality: Right;  CDE: 14.04    Current Medications: Current Meds  Medication Sig  . acetaminophen (TYLENOL) 650 MG CR  tablet Take 650 mg by mouth every 8 (eight) hours as needed for pain. Take 1-2 every 8 hours as needed  . cholecalciferol (VITAMIN D) 25 MCG (1000 UNIT) tablet Take 1,000 Units by mouth daily.  . furosemide (LASIX) 40 MG tablet Take 1 tablet (40 mg total) by mouth daily. Take once on Tuesday and Friday.  . metFORMIN (GLUCOPHAGE) 1000 MG tablet Take 1,000 mg by mouth 2 (two) times daily with a meal.  . metoprolol succinate (TOPROL XL) 25 MG 24 hr tablet Take 0.5 tablets (12.5 mg total) by mouth daily.  . Multiple Vitamin (MULTIVITAMIN WITH MINERALS) TABS tablet Take 1 tablet by mouth daily.  . Naphazoline-Pheniramine 0.027-0.315 % SOLN Place 1 drop into both eyes 3 (three) times daily as needed (dry/irritated/allergy eye relief).  Marland Kitchen oxymetazoline (AFRIN) 0.05 % nasal spray Place 1 spray into both nostrils at bedtime as needed for congestion.  . pioglitazone (ACTOS) 45 MG tablet Take 45 mg by mouth daily.  . quinapril (ACCUPRIL) 10 MG tablet Take 10 mg by mouth daily.  . tamsulosin (FLOMAX) 0.4 MG CAPS capsule Take 0.4 mg by mouth daily.     Allergies:   Nsaids   Social History   Socioeconomic History  . Marital status: Married    Spouse name: Not on file  . Number of children: Not on file  . Years of education: Not on file  . Highest education level: Not  on file  Occupational History  . Not on file  Tobacco Use  . Smoking status: Former Research scientist (life sciences)  . Smokeless tobacco: Never Used  Substance and Sexual Activity  . Alcohol use: Never  . Drug use: Never  . Sexual activity: Not Currently  Other Topics Concern  . Not on file  Social History Narrative  . Not on file   Social Determinants of Health   Financial Resource Strain: Not on file  Food Insecurity: Not on file  Transportation Needs: Not on file  Physical Activity: Not on file  Stress: Not on file  Social Connections: Not on file     Family History: The patient's family history includes Cancer in his father.  ROS:    Review of Systems  Constitution: Negative for decreased appetite, fever and weight gain.  HENT: Negative for congestion, ear discharge, hoarse voice and sore throat.   Eyes: Negative for discharge, redness, vision loss in right eye and visual halos.  Cardiovascular: Negative for chest pain, dyspnea on exertion, leg swelling, orthopnea and palpitations.  Respiratory: Negative for cough, hemoptysis, shortness of breath and snoring.   Endocrine: Negative for heat intolerance and polyphagia.  Hematologic/Lymphatic: Negative for bleeding problem. Does not bruise/bleed easily.  Skin: Negative for flushing, nail changes, rash and suspicious lesions.  Musculoskeletal: Negative for arthritis, joint pain, muscle cramps, myalgias, neck pain and stiffness.  Gastrointestinal: Negative for abdominal pain, bowel incontinence, diarrhea and excessive appetite.  Genitourinary: Negative for decreased libido, genital sores and incomplete emptying.  Neurological: Negative for brief paralysis, focal weakness, headaches and loss of balance.  Psychiatric/Behavioral: Negative for altered mental status, depression and suicidal ideas.  Allergic/Immunologic: Negative for HIV exposure and persistent infections.    EKGs/Labs/Other Studies Reviewed:    The following studies were reviewed today:   EKG: None today  ZIO monitor The patient wore the monitor for 3 days 4 hours starting November 01, 2020. Indication: Premature ventricular complex  The minimum heart rate was 58 bpm, maximum heart rate was 114 bpm, and average heart rate was 85 bpm. Predominant underlying rhythm was Sinus Rhythm.  58 Supraventricular Tachycardia runs occurred, the run with the fastest interval lasting 14 beats with a maximum rate of 114 bpm, the longest lasting 17 beats with an average rate of 100 bpm.   Premature atrial complexes were ccasional (3.4%, 13034). Premature Ventricular complexes were frequent (26.7%, [id number]).  No  ventricular tachycardia, no pauses, No AV block and no atrial fibrillation present. No patient triggered events or diary event noted.  Conclusion: This study is remarkable for the following:                       1.  Supraventricular tachycardia which is likely atrial tachycardia with variable block.                       2.  Occasional premature atrial complexes.                       3.  Frequent premature ventricular complexes.  Transthoracic echocardiogram Nov 29, 2020 IMPRESSIONS    1. Left ventricular ejection fraction, by estimation, is 55 to 60%. The  left ventricle has normal function. The left ventricle has no regional  wall motion abnormalities. Left ventricular diastolic parameters are  consistent with Grade I diastolic  dysfunction (impaired relaxation).  2. Right ventricular systolic function is normal. The right ventricular  size is normal.  There is moderately elevated pulmonary artery systolic  pressure.  3. The mitral valve is grossly normal. There is mild thickening of the  mitral valve leaflet(s). There is moderate calcification of the mitral  valve leaflet(s). Trivial mitral valve regurgitation. Moderate mitral  annular calcification. Mildly elevated  gradients across the mitral valve without significant stenosis.  4. The aortic valve is tricuspid. There is mild calcification of the  aortic valve. There is mild thickening of the aortic valve. Aortic valve  regurgitation is not visualized. Mild aortic valve sclerosis is present, with no evidence of aortic valve  stenosis.  5. Aortic dilatation noted. There is mild dilatation of the aortic root, measuring 38 mm.  6. The inferior vena cava is normal in size with greater than 50% respiratory variability, suggesting right atrial pressure of 3 mmHg.   Recent Labs: No results found for requested labs within last 8760 hours.  Recent Lipid Panel No results found for: CHOL, TRIG, HDL, CHOLHDL, VLDL, LDLCALC,  LDLDIRECT  Physical Exam:    VS:  BP 138/68 (BP Location: Left Arm, Patient Position: Sitting, Cuff Size: Normal)   Pulse 84   Ht 5\' 9"  (1.753 m)   Wt 158 lb (71.7 kg)   SpO2 94%   BMI 23.33 kg/m     Wt Readings from Last 3 Encounters:  12/04/20 158 lb (71.7 kg)  11/01/20 161 lb 0.6 oz (73 kg)  12/05/19 152 lb (68.9 kg)     GEN: Well nourished, well developed in no acute distress HEENT: Normal NECK: No JVD; No carotid bruits LYMPHATICS: No lymphadenopathy CARDIAC: S1S2 noted,RRR, no murmurs, rubs, gallops RESPIRATORY:  Clear to auscultation without rales, wheezing or rhonchi  ABDOMEN: Soft, non-tender, non-distended, +bowel sounds, no guarding. EXTREMITIES: edema, No cyanosis, no clubbing MUSCULOSKELETAL:  No deformity  SKIN: Warm and dry NEUROLOGIC:  Alert and oriented x 3, non-focal PSYCHIATRIC:  Normal affect, good insight  ASSESSMENT:    1. Frequent PVCs   2. Chronic diastolic (congestive) heart failure (HCC)   3. Paroxysmal atrial tachycardia ([place])   4. Bilateral leg edema   5. Type 2 diabetes mellitus with hyperosmolarity without coma, without long-term current use of insulin (HCC)    PLAN:    He does have significant bilateral leg edema.  I spoke with the patient he is not taking his diuretics as he tells me that his PCP asked him to not take that until he weighed himself.  He is not weighing himself daily.  So explained to the patient that we should at least at minimum have him take his diuretic twice a day to avoid worsening leg edema and heart failure exacerbation to avoid hospitalization.  He is in agreement.  I am going to get BMP and mag to assess his kidney function.  In addition he has some palpitations his monitor does show significant burden of atrial tachycardia, frequent PVCs as well as occasional PACs.  We will try low-dose beta-blocker to see if this is going to help.  Blood pressure is acceptable, continue with current antihypertensive  regimen.  This is being managed by his primary care doctor.  No adjustments for antidiabetic medications were made today.  The patient is in agreement with the above plan. The patient left the office in stable condition.  The patient will follow up in 6 months or sooner if needed   Medication Adjustments/Labs and Tests Ordered: Current medicines are reviewed at length with the patient today.  Concerns regarding medicines are outlined above.  Orders Placed This Encounter  Procedures  . Basic metabolic panel  . Magnesium   Meds ordered this encounter  Medications  . furosemide (LASIX) 40 MG tablet    Sig: Take 1 tablet (40 mg total) by mouth daily. Take once on Tuesday and Friday.    Dispense:  90 tablet    Refill:  3  . metoprolol succinate (TOPROL XL) 25 MG 24 hr tablet    Sig: Take 0.5 tablets (12.5 mg total) by mouth daily.    Dispense:  45 tablet    Refill:  3    Patient Instructions  Medication Instructions:  Your physician has recommended you make the following change in your medication:  START: Lasix 40 mg once daily on Tuesday and Friday START: Toprol-XL 12.5 mg nightly *If you need a refill on your cardiac medications before your next appointment, please call your pharmacy*   Lab Work: Your physician recommends that you return for lab work: TODAY: BMET, [place] If you have labs (blood work) drawn today and your tests are completely normal, you will receive your results only by: Marland Kitchen MyChart Message (if you have MyChart) OR . A paper copy in the mail If you have any lab test that is abnormal or we need to change your treatment, we will call you to review the results.   Testing/Procedures: None   Follow-Up: At [facility], you and your health needs are our priority.  As part of our continuing mission to provide you with exceptional heart care, we have created designated Provider Care Teams.  These Care Teams include your primary Cardiologist (physician) and Advanced  Practice Providers (APPs -  Physician Assistants and Nurse Practitioners) who all work together to provide you with the care you need, when you need it.  We recommend signing up for the patient portal called "MyChart".  Sign up information is provided on this After Visit Summary.  MyChart is used to connect with patients for Virtual Visits (Telemedicine).  Patients are able to view lab/test results, encounter notes, upcoming appointments, etc.  Non-urgent messages can be sent to your provider as well.   To learn more about what you can do with MyChart, go to [email or web address].    Your next appointment:   6 month(s)  The format for your next appointment:   In Person  Provider:   Berniece Salines, DO   Other Instructions      Adopting a Healthy Lifestyle.  Know what a healthy weight is for you (roughly BMI <25) and aim to maintain this   Aim for 7+ servings of fruits and vegetables daily   65-80+ fluid ounces of water or unsweet tea for healthy kidneys   Limit to max 1 drink of alcohol per day; avoid smoking/tobacco   Limit animal fats in diet for cholesterol and heart health - choose grass fed whenever available   Avoid highly processed foods, and foods high in saturated/trans fats   Aim for low stress - take time to unwind and care for your mental health   Aim for 150 min of moderate intensity exercise weekly for heart health, and weights twice weekly for bone health   Aim for 7-9 hours of sleep daily   When it comes to diets, agreement about the perfect plan isnt easy to find, even among the experts. Experts at the [place] developed an idea known as the Healthy Eating Plate. Just imagine a plate divided into logical, healthy portions.   The emphasis  is on diet quality:   Load up on vegetables and fruits - one-half of your plate: Aim for color and variety, and remember that potatoes dont count.   Go for whole grains - one-quarter of your  plate: Whole wheat, barley, wheat berries, quinoa, oats, brown rice, and foods made with them. If you want pasta, go with whole wheat pasta.   Protein power - one-quarter of your plate: Fish, chicken, beans, and nuts are all healthy, versatile protein sources. Limit red meat.   The diet, however, does go beyond the plate, offering a few other suggestions.   Use healthy plant oils, such as olive, canola, soy, corn, sunflower and peanut. Check the labels, and avoid partially hydrogenated oil, which have unhealthy trans fats.   If youre thirsty, drink water. Coffee and tea are good in moderation, but skip sugary drinks and limit milk and dairy products to one or two daily servings.   The type of carbohydrate in the diet is more important than the amount. Some sources of carbohydrates, such as vegetables, fruits, whole grains, and beans-are healthier than others.   Finally, stay active  Signed, Berniece Salines, DO  12/04/2020 8:32 PM    [facility]

## 2020-12-05 DIAGNOSIS — M1712 Unilateral primary osteoarthritis, left knee: Secondary | ICD-10-CM | POA: Diagnosis not present

## 2020-12-05 DIAGNOSIS — M25562 Pain in left knee: Secondary | ICD-10-CM | POA: Diagnosis not present

## 2020-12-05 DIAGNOSIS — M67362 Transient synovitis, left knee: Secondary | ICD-10-CM | POA: Diagnosis not present

## 2020-12-05 DIAGNOSIS — M25762 Osteophyte, left knee: Secondary | ICD-10-CM | POA: Diagnosis not present

## 2020-12-05 LAB — BASIC METABOLIC PANEL
BUN/Creatinine Ratio: 21 (ref 10–24)
BUN: 25 mg/dL (ref 10–36)
CO2: 23 mmol/L (ref 20–29)
Calcium: 9.5 mg/dL (ref 8.6–10.2)
Chloride: 101 mmol/L (ref 96–106)
Creatinine, Ser: 1.19 mg/dL (ref 0.76–1.27)
Glucose: 155 mg/dL — ABNORMAL HIGH (ref 65–99)
Potassium: 5.2 mmol/L (ref 3.5–5.2)
Sodium: 138 mmol/L (ref 134–144)
eGFR: 57 mL/min/{1.73_m2} — ABNORMAL LOW (ref >59)

## 2020-12-05 LAB — MAGNESIUM: Magnesium: 1.6 mg/dL (ref 1.6–2.3)

## 2020-12-26 DIAGNOSIS — E1169 Type 2 diabetes mellitus with other specified complication: Secondary | ICD-10-CM | POA: Diagnosis not present

## 2020-12-26 DIAGNOSIS — M179 Osteoarthritis of knee, unspecified: Secondary | ICD-10-CM | POA: Diagnosis not present

## 2020-12-26 DIAGNOSIS — N400 Enlarged prostate without lower urinary tract symptoms: Secondary | ICD-10-CM | POA: Diagnosis not present

## 2020-12-26 DIAGNOSIS — N183 Chronic kidney disease, stage 3 unspecified: Secondary | ICD-10-CM | POA: Diagnosis not present

## 2020-12-26 DIAGNOSIS — E782 Mixed hyperlipidemia: Secondary | ICD-10-CM | POA: Diagnosis not present

## 2020-12-26 DIAGNOSIS — I10 Essential (primary) hypertension: Secondary | ICD-10-CM | POA: Diagnosis not present

## 2020-12-26 DIAGNOSIS — D500 Iron deficiency anemia secondary to blood loss (chronic): Secondary | ICD-10-CM | POA: Diagnosis not present

## 2020-12-26 DIAGNOSIS — I509 Heart failure, unspecified: Secondary | ICD-10-CM | POA: Diagnosis not present

## 2021-02-19 ENCOUNTER — Telehealth: Payer: Self-pay | Admitting: Sports Medicine

## 2021-02-19 DIAGNOSIS — M170 Bilateral primary osteoarthritis of knee: Secondary | ICD-10-CM

## 2021-02-19 NOTE — Telephone Encounter (Signed)
Khase is interested in knee arthroplasty, his wife during her visit asked me to place a referral for him to Dr. Durward Fortes.  He is getting surgical clearance from his PCP Dr. Harrington Challenger.

## 2021-02-26 ENCOUNTER — Encounter: Payer: Self-pay | Admitting: Orthopaedic Surgery

## 2021-02-26 ENCOUNTER — Other Ambulatory Visit: Payer: Self-pay

## 2021-02-26 ENCOUNTER — Ambulatory Visit (INDEPENDENT_AMBULATORY_CARE_PROVIDER_SITE_OTHER): Payer: Medicare Other | Admitting: Orthopaedic Surgery

## 2021-02-26 ENCOUNTER — Ambulatory Visit (INDEPENDENT_AMBULATORY_CARE_PROVIDER_SITE_OTHER): Payer: Medicare Other

## 2021-02-26 VITALS — Ht 69.0 in | Wt 158.0 lb

## 2021-02-26 DIAGNOSIS — M25561 Pain in right knee: Secondary | ICD-10-CM

## 2021-02-26 DIAGNOSIS — G8929 Other chronic pain: Secondary | ICD-10-CM | POA: Diagnosis not present

## 2021-02-26 DIAGNOSIS — M170 Bilateral primary osteoarthritis of knee: Secondary | ICD-10-CM

## 2021-02-26 NOTE — Progress Notes (Signed)
Office Visit Note   Patient: Darin Moran           Date of Birth: Nov 27, 1926           MRN: [id number] Visit Date: 02/26/2021              Requested by: Silverio Decamp, [address] [place],  [place] [address] PCP: Lawerance Cruel, MD   Assessment & Plan: Visit Diagnoses:  1. Chronic pain of right knee   2. Primary osteoarthritis of both knees     Plan: Darin Moran is accompanied by his son and wife and is evaluated for bilateral knee pain.  He is having more trouble on the right than the left.  X-rays demonstrate end-stage osteoarthritis with bone-on-bone in the lateral compartment of the right knee but significant degenerative changes of the medial and patellofemoral compartments as well.  According to the family he has had cortisone injections in the past and may be even viscosupplementation.  He is tried several braces which do not really make a difference.  He notes that he is not having too much pain once he is up but he has difficulty arising from a sitting or lying position to standing.  He has a number of comorbidities and presently is taking "heart medicine, fluid pill and is diabetic taking metformin.  Long discussion regarding his arthritis and definitive treatment of knee replacement.  Darin Moran was adamant about having any surgery.  I am not sure that further injections or bracing is going to help as he is tried those in the past.  He does have an appointment to see his primary care physician in the near future and they can discuss whether he is a medical candidate for surgery and the family can decide.  Be happy to see him back at any time.  All questions were answered  Follow-Up Instructions: Return if symptoms worsen or fail to improve.   Orders:  Orders Placed This Encounter  Procedures   XR KNEE 3 VIEW RIGHT   No orders of the defined types were placed in this encounter.     Procedures: No procedures performed   Clinical  Data: No additional findings.   Subjective: Chief Complaint  Patient presents with   Left Knee - Pain   Right Knee - Pain  Patient presents today for right knee pain. He said that it has been bothering him for years, but worsening with time. He said that it only hurts with walking or getting up. It swells and gives way. He does not take anything for pain. He is accompanied by his wife and son.  They relate that he has had cortisone in the past without much relief.  He also has worn braces which are not comfortable.  He does have a number of medical comorbidities including diabetes, peripheral edema and some heart issues. He relates not having much pain when he is up and about but apparently does not walk without use of some ambulatory aid and is not very active with his daily activities because of his knees and other medical problems  HPI  Review of Systems   Objective: Vital Signs: Ht '5\' 9"'$  (1.753 m)   Wt 158 lb (71.7 kg)   BMI 23.33 kg/m   Physical Exam Constitutional:      Appearance: He is well-developed.  Pulmonary:     Effort: Pulmonary effort is normal.  Skin:    General: Skin is warm and dry.  Neurological:     Mental Status: He is alert and oriented to person, place, and time.  Psychiatric:        Behavior: Behavior normal.    Ortho Exam examined in the wheelchair.  He is hard of hearing.  He lacked a few degrees to full extension of both of his knees.  There was a positive effusion on the right knee but the knee was not particularly hot or warm.  No instability.  I can flex him over 100 degrees.  There was some patella crepitation.  Increased valgus with his knee in extension but no significant opening medially.  No calf pain.  Venous stasis changes distally with some chronic ankle nonpitting edema motor exam intact.  I thought he had some altered sensibility of both of his feet.  Because of the swelling I could not feel pulses but had good capillary refill  Specialty  Comments:  No specialty comments available.  Imaging: XR KNEE 3 VIEW RIGHT  Result Date: 02/26/2021 Films of the right knee are obtained in 3 projections standing.  There are end-stage osteoarthritic changes but predominate in the lateral compartment where there is bone-on-bone.  There is decreased bone density and subchondral sclerosis.  Degenerative changes are also identified in the lateral compartment and patellofemoral joint.  No acute changes.    PMFS History: Patient Active Problem List   Diagnosis Date Noted   Osteoarthritis of both knees 02/26/2021   Bilateral leg edema 11/01/2020   Murmur, cardiac 11/01/2020   PVC (premature ventricular contraction) 11/01/2020   Hypertension    Diabetes mellitus without complication (HCC)    Arthritis    Bilateral lower extremity edema 10/29/2020   Baastrup's syndrome with lumbar spinal stenosis 07/03/2020   Baker's cyst, left 06/21/2018   Pseudogout of knee 04/27/2018   Primary osteoarthritis of both knees 02/24/2018   Past Medical History:  Diagnosis Date   Arthritis    Diabetes mellitus without complication ([place])    Hypertension     Family History  Problem Relation Age of Onset   Cancer Father        colon    Past Surgical History:  Procedure Laterality Date   CATARACT EXTRACTION W/PHACO Left 11/21/2019   Procedure: CATARACT EXTRACTION PHACO AND INTRAOCULAR LENS PLACEMENT (IOC) CDE:  15.38;  Surgeon: Baruch Goldmann, MD;  Location: [facility];  Service: Ophthalmology;  Laterality: Left;   CATARACT EXTRACTION W/PHACO Right 12/05/2019   Procedure: CATARACT EXTRACTION PHACO AND INTRAOCULAR LENS PLACEMENT RIGHT EYE;  Surgeon: Baruch Goldmann, MD;  Location: [facility];  Service: Ophthalmology;  Laterality: Right;  CDE: 14.04   Social History   Occupational History   Not on file  Tobacco Use   Smoking status: Former   Smokeless tobacco: Never  Substance and Sexual Activity   Alcohol use: Never   Drug use: Never   Sexual activity: Not  Currently

## 2021-03-06 DIAGNOSIS — E1169 Type 2 diabetes mellitus with other specified complication: Secondary | ICD-10-CM | POA: Diagnosis not present

## 2021-03-06 DIAGNOSIS — N183 Chronic kidney disease, stage 3 unspecified: Secondary | ICD-10-CM | POA: Diagnosis not present

## 2021-03-06 DIAGNOSIS — I10 Essential (primary) hypertension: Secondary | ICD-10-CM | POA: Diagnosis not present

## 2021-03-06 DIAGNOSIS — Z7984 Long term (current) use of oral hypoglycemic drugs: Secondary | ICD-10-CM | POA: Diagnosis not present

## 2021-03-06 DIAGNOSIS — R609 Edema, unspecified: Secondary | ICD-10-CM | POA: Diagnosis not present

## 2021-03-06 DIAGNOSIS — M179 Osteoarthritis of knee, unspecified: Secondary | ICD-10-CM | POA: Diagnosis not present

## 2021-03-13 DIAGNOSIS — N183 Chronic kidney disease, stage 3 unspecified: Secondary | ICD-10-CM | POA: Diagnosis not present

## 2021-03-13 DIAGNOSIS — I10 Essential (primary) hypertension: Secondary | ICD-10-CM | POA: Diagnosis not present

## 2021-03-13 DIAGNOSIS — D500 Iron deficiency anemia secondary to blood loss (chronic): Secondary | ICD-10-CM | POA: Diagnosis not present

## 2021-03-13 DIAGNOSIS — E782 Mixed hyperlipidemia: Secondary | ICD-10-CM | POA: Diagnosis not present

## 2021-03-13 DIAGNOSIS — I509 Heart failure, unspecified: Secondary | ICD-10-CM | POA: Diagnosis not present

## 2021-03-13 DIAGNOSIS — E1169 Type 2 diabetes mellitus with other specified complication: Secondary | ICD-10-CM | POA: Diagnosis not present

## 2021-03-13 DIAGNOSIS — M179 Osteoarthritis of knee, unspecified: Secondary | ICD-10-CM | POA: Diagnosis not present

## 2021-03-13 DIAGNOSIS — N400 Enlarged prostate without lower urinary tract symptoms: Secondary | ICD-10-CM | POA: Diagnosis not present

## 2021-04-02 DIAGNOSIS — E1169 Type 2 diabetes mellitus with other specified complication: Secondary | ICD-10-CM | POA: Diagnosis not present

## 2021-04-02 DIAGNOSIS — I10 Essential (primary) hypertension: Secondary | ICD-10-CM | POA: Diagnosis not present

## 2021-04-02 DIAGNOSIS — N400 Enlarged prostate without lower urinary tract symptoms: Secondary | ICD-10-CM | POA: Diagnosis not present

## 2021-04-02 DIAGNOSIS — D500 Iron deficiency anemia secondary to blood loss (chronic): Secondary | ICD-10-CM | POA: Diagnosis not present

## 2021-04-02 DIAGNOSIS — N183 Chronic kidney disease, stage 3 unspecified: Secondary | ICD-10-CM | POA: Diagnosis not present

## 2021-04-02 DIAGNOSIS — E782 Mixed hyperlipidemia: Secondary | ICD-10-CM | POA: Diagnosis not present

## 2021-04-02 DIAGNOSIS — M179 Osteoarthritis of knee, unspecified: Secondary | ICD-10-CM | POA: Diagnosis not present

## 2021-04-02 DIAGNOSIS — I509 Heart failure, unspecified: Secondary | ICD-10-CM | POA: Diagnosis not present

## 2021-05-21 DIAGNOSIS — H2513 Age-related nuclear cataract, bilateral: Secondary | ICD-10-CM | POA: Diagnosis not present

## 2021-05-21 DIAGNOSIS — H40033 Anatomical narrow angle, bilateral: Secondary | ICD-10-CM | POA: Diagnosis not present

## 2021-06-04 ENCOUNTER — Ambulatory Visit (INDEPENDENT_AMBULATORY_CARE_PROVIDER_SITE_OTHER): Payer: Medicare Other | Admitting: Cardiology

## 2021-06-04 ENCOUNTER — Other Ambulatory Visit: Payer: Self-pay

## 2021-06-04 ENCOUNTER — Encounter: Payer: Self-pay | Admitting: Cardiology

## 2021-06-04 VITALS — BP 134/76 | HR 77 | Ht 69.0 in | Wt 162.4 lb

## 2021-06-04 DIAGNOSIS — I491 Atrial premature depolarization: Secondary | ICD-10-CM

## 2021-06-04 DIAGNOSIS — I471 Supraventricular tachycardia: Secondary | ICD-10-CM | POA: Diagnosis not present

## 2021-06-04 DIAGNOSIS — I493 Ventricular premature depolarization: Secondary | ICD-10-CM | POA: Diagnosis not present

## 2021-06-04 DIAGNOSIS — I10 Essential (primary) hypertension: Secondary | ICD-10-CM

## 2021-06-04 DIAGNOSIS — E119 Type 2 diabetes mellitus without complications: Secondary | ICD-10-CM

## 2021-06-04 MED ORDER — METOPROLOL SUCCINATE ER 25 MG PO TB24: 12.5000 mg | ORAL_TABLET | Freq: Every day | ORAL | 3 refills | Status: DC

## 2021-06-04 MED ORDER — FUROSEMIDE 40 MG PO TABS: 40.0000 mg | ORAL_TABLET | Freq: Every day | ORAL | 3 refills | Status: AC

## 2021-06-04 NOTE — Patient Instructions (Signed)

## 2021-06-04 NOTE — Progress Notes (Signed)
Cardiology Office Note:    Date:  06/04/2021   ID:  Darin Moran, DOB 1927/04/11, MRN [id number]  PCP:  Lawerance Cruel, MD  Cardiologist:  Berniece Salines, DO  Electrophysiologist:  None   Referring MD: Lawerance Cruel, MD   " I am doing fine"  History of Present Illness:    Darin Moran is a 87 y.o. male with a hx of diabetes mellitus, hypertension, frequent PVCs, atrial tachycardia and occasional PACs seen on ZIO monitor, chronic diastolic heart failure is here today for follow-up visit.  I first saw the patient on November 01, 2020 at that time he presented to establish cardiac care.  During that visit he had some PVCs on his EKG we placed a ZIO monitor on the patient to assess his PVC burden.  At that time he also has significant bilateral leg edema I asked the patient to take his Lasix 40 mg twice a day for 7 days and go back to taking his Lasix 40 mg daily.   Saw the patient on Dec 04, 2020 at that time we talked about his monitor results.  Start him on low-dose beta-blocker to help with the frequent PVCs, occasional PACs as well as a supraventricular tachycardia.  Since I saw the patient he has been doing well.  According to patient wife she notes that he has a ophthalmologist appointment and might be undergoing a procedure.  He denies any chest pain or shortness of breath.   Past Medical History:  Diagnosis Date   Arthritis    Diabetes mellitus without complication ([place])    Hypertension     Past Surgical History:  Procedure Laterality Date   CATARACT EXTRACTION W/PHACO Left 11/21/2019   Procedure: CATARACT EXTRACTION PHACO AND INTRAOCULAR LENS PLACEMENT (IOC) CDE:  15.38;  Surgeon: Baruch Goldmann, MD;  Location: [facility];  Service: Ophthalmology;  Laterality: Left;   CATARACT EXTRACTION W/PHACO Right 12/05/2019   Procedure: CATARACT EXTRACTION PHACO AND INTRAOCULAR LENS PLACEMENT RIGHT EYE;  Surgeon: Baruch Goldmann, MD;  Location: [facility];  Service: Ophthalmology;   Laterality: Right;  CDE: 14.04    Current Medications: Current Meds  Medication Sig   acetaminophen (TYLENOL) 650 MG CR tablet Take 650 mg by mouth every 8 (eight) hours as needed for pain. Take 1-2 every 8 hours as needed   cholecalciferol (VITAMIN D) 25 MCG (1000 UNIT) tablet Take 1,000 Units by mouth daily.   metFORMIN (GLUCOPHAGE) 500 MG tablet Take 500 mg by mouth 2 (two) times daily with a meal.   Multiple Vitamin (MULTIVITAMIN WITH MINERALS) TABS tablet Take 1 tablet by mouth daily.   pioglitazone (ACTOS) 45 MG tablet Take 45 mg by mouth daily.   quinapril (ACCUPRIL) 10 MG tablet Take 10 mg by mouth daily.   tamsulosin (FLOMAX) 0.4 MG CAPS capsule Take 0.4 mg by mouth daily.   [DISCONTINUED] furosemide (LASIX) 40 MG tablet Take 1 tablet (40 mg total) by mouth daily. Take once on Tuesday and Friday. (Patient taking differently: Take 40 mg by mouth daily.)   [DISCONTINUED] metoprolol succinate (TOPROL XL) 25 MG 24 hr tablet Take 0.5 tablets (12.5 mg total) by mouth daily.     Allergies:   Nsaids   Social History   Socioeconomic History   Marital status: Married    Spouse name: Not on file   Number of children: Not on file   Years of education: Not on file   Highest education level: Not on file  Occupational History   Not  on file  Tobacco Use   Smoking status: Former   Smokeless tobacco: Never  Substance and Sexual Activity   Alcohol use: Never   Drug use: Never   Sexual activity: Not Currently  Other Topics Concern   Not on file  Social History Narrative   Not on file   Social Determinants of Health   Financial Resource Strain: Not on file  Food Insecurity: Not on file  Transportation Needs: Not on file  Physical Activity: Not on file  Stress: Not on file  Social Connections: Not on file     Family History: The patient's family history includes Cancer in his father.  ROS:   Review of Systems  Constitution: Negative for decreased appetite, fever and weight  gain.  HENT: Negative for congestion, ear discharge, hoarse voice and sore throat.   Eyes: Negative for discharge, redness, vision loss in right eye and visual halos.  Cardiovascular: Negative for chest pain, dyspnea on exertion, leg swelling, orthopnea and palpitations.  Respiratory: Negative for cough, hemoptysis, shortness of breath and snoring.   Endocrine: Negative for heat intolerance and polyphagia.  Hematologic/Lymphatic: Negative for bleeding problem. Does not bruise/bleed easily.  Skin: Negative for flushing, nail changes, rash and suspicious lesions.  Musculoskeletal: Negative for arthritis, joint pain, muscle cramps, myalgias, neck pain and stiffness.  Gastrointestinal: Negative for abdominal pain, bowel incontinence, diarrhea and excessive appetite.  Genitourinary: Negative for decreased libido, genital sores and incomplete emptying.  Neurological: Negative for brief paralysis, focal weakness, headaches and loss of balance.  Psychiatric/Behavioral: Negative for altered mental status, depression and suicidal ideas.  Allergic/Immunologic: Negative for HIV exposure and persistent infections.    EKGs/Labs/Other Studies Reviewed:    The following studies were reviewed today:   EKG:  The ekg ordered today demonstrates sinus rhythm, heart rate 77 bpm with underlying first-degree AV block.    ZIO monitor The patient wore the monitor for 3 days 4 hours starting November 01, 2020. Indication: Premature ventricular complex   The minimum heart rate was 58 bpm, maximum heart rate was 114 bpm, and average heart rate was 85 bpm. Predominant underlying rhythm was Sinus Rhythm.   58 Supraventricular Tachycardia runs occurred, the run with the fastest interval lasting 14 beats with a maximum rate of 114 bpm, the longest lasting 17 beats with an average rate of 100 bpm.    Premature atrial complexes were ccasional (3.4%, 13034). Premature Ventricular complexes were frequent (26.7%, [id number]).    No ventricular tachycardia, no pauses, No AV block and no atrial fibrillation present. No patient triggered events or diary event noted.   Conclusion: This study is remarkable for the following:                       1.  Supraventricular tachycardia which is likely atrial tachycardia with variable block.                       2.  Occasional premature atrial complexes.                       3.  Frequent premature ventricular complexes.   Transthoracic echocardiogram Nov 29, 2020 IMPRESSIONS   1. Left ventricular ejection fraction, by estimation, is 55 to 60%. The  left ventricle has normal function. The left ventricle has no regional  wall motion abnormalities. Left ventricular diastolic parameters are  consistent with Grade I diastolic  dysfunction (impaired relaxation).   2.  Right ventricular systolic function is normal. The right ventricular  size is normal. There is moderately elevated pulmonary artery systolic  pressure.   3. The mitral valve is grossly normal. There is mild thickening of the  mitral valve leaflet(s). There is moderate calcification of the mitral  valve leaflet(s). Trivial mitral valve regurgitation. Moderate mitral  annular calcification. Mildly elevated  gradients across the mitral valve without significant stenosis.   4. The aortic valve is tricuspid. There is mild calcification of the  aortic valve. There is mild thickening of the aortic valve. Aortic valve  regurgitation is not visualized. Mild aortic valve sclerosis is present, with no evidence of aortic valve  stenosis.   5. Aortic dilatation noted. There is mild dilatation of the aortic root, measuring 38 mm.   6. The inferior vena cava is normal in size with greater than 50% respiratory variability, suggesting right atrial pressure of 3 mmHg.     Recent Labs: 12/04/2020: BUN 25; Creatinine, Ser 1.19; Magnesium 1.6; Potassium 5.2; Sodium 138  Recent Lipid Panel No results found for: CHOL, TRIG, HDL,  CHOLHDL, VLDL, LDLCALC, LDLDIRECT  Physical Exam:    VS:  BP 134/76   Pulse 77   Ht 5\' 9"  (1.753 m)   Wt 162 lb 6.4 oz (73.7 kg)   SpO2 99%   BMI 23.98 kg/m     Wt Readings from Last 3 Encounters:  06/04/21 162 lb 6.4 oz (73.7 kg)  02/26/21 158 lb (71.7 kg)  12/04/20 158 lb (71.7 kg)     GEN: Well nourished, well developed in no acute distress HEENT: Normal NECK: No JVD; No carotid bruits LYMPHATICS: No lymphadenopathy CARDIAC: S1S2 noted,RRR, no murmurs, rubs, gallops RESPIRATORY:  Clear to auscultation without rales, wheezing or rhonchi  ABDOMEN: Soft, non-tender, non-distended, +bowel sounds, no guarding. EXTREMITIES: No edema, No cyanosis, no clubbing MUSCULOSKELETAL:  No deformity  SKIN: Warm and dry NEUROLOGIC:  Alert and oriented x 3, non-focal PSYCHIATRIC:  Normal affect, good insight  ASSESSMENT:    1. Frequent PVCs   2. PVC (premature ventricular contraction)   3. Diabetes mellitus without complication ([place])   4. PSVT (paroxysmal supraventricular tachycardia) ([place])   5. PAC (premature atrial contraction)   6. Hypertension, unspecified type    PLAN:    We will continue the patient on his metoprolol succinate 12.5 mg daily.  His leg edema has improved significantly he takes his Lasix 40 mg twice a week continue that for now. Blood pressure is acceptable, continue with current antihypertensive regimen. Diabetes mellitus is being managed by his primary care provider. In terms of his eye surgery, the patient does not have any unstable cardiac conditions.  Upon evaluation today, he can achieve 4 METs or greater without anginal symptoms.  According to [facility] and AHA guidelines, he requires no further cardiac workup prior to his noncardiac surgery and should be at acceptable risk.  Our service is available as necessary in the perioperative period.   The patient is in agreement with the above plan. The patient left the office in stable condition.  The patient will follow  up in 1 year or sooner if needed.  Medication Adjustments/Labs and Tests Ordered: Current medicines are reviewed at length with the patient today.  Concerns regarding medicines are outlined above.  Orders Placed This Encounter  Procedures   EKG 12-Lead   Meds ordered this encounter  Medications   furosemide (LASIX) 40 MG tablet    Sig: Take 1 tablet (40 mg total) by  mouth daily. Take once on Tuesday and Friday.    Dispense:  90 tablet    Refill:  3   metoprolol succinate (TOPROL XL) 25 MG 24 hr tablet    Sig: Take 0.5 tablets (12.5 mg total) by mouth daily.    Dispense:  45 tablet    Refill:  3    Patient Instructions  Medication Instructions:  Your physician recommends that you continue on your current medications as directed. Please refer to the Current Medication list given to you today.  *If you need a refill on your cardiac medications before your next appointment, please call your pharmacy*   Lab Work: None If you have labs (blood work) drawn today and your tests are completely normal, you will receive your results only by: [place] (if you have MyChart) OR A paper copy in the mail If you have any lab test that is abnormal or we need to change your treatment, we will call you to review the results.   Testing/Procedures: None   Follow-Up: At [facility], you and your health needs are our priority.  As part of our continuing mission to provide you with exceptional heart care, we have created designated Provider Care Teams.  These Care Teams include your primary Cardiologist (physician) and Advanced Practice Providers (APPs -  Physician Assistants and Nurse Practitioners) who all work together to provide you with the care you need, when you need it.  We recommend signing up for the patient portal called "MyChart".  Sign up information is provided on this After Visit Summary.  MyChart is used to connect with patients for Virtual Visits (Telemedicine).  Patients  are able to view lab/test results, encounter notes, upcoming appointments, etc.  Non-urgent messages can be sent to your provider as well.   To learn more about what you can do with MyChart, go to [email or web address].    Your next appointment:   1 year(s)  The format for your next appointment:   In Person  Provider:   Berniece Salines, DO     Other Instructions     Adopting a Healthy Lifestyle.  Know what a healthy weight is for you (roughly BMI <25) and aim to maintain this   Aim for 7+ servings of fruits and vegetables daily   65-80+ fluid ounces of water or unsweet tea for healthy kidneys   Limit to max 1 drink of alcohol per day; avoid smoking/tobacco   Limit animal fats in diet for cholesterol and heart health - choose grass fed whenever available   Avoid highly processed foods, and foods high in saturated/trans fats   Aim for low stress - take time to unwind and care for your mental health   Aim for 150 min of moderate intensity exercise weekly for heart health, and weights twice weekly for bone health   Aim for 7-9 hours of sleep daily   When it comes to diets, agreement about the perfect plan isnt easy to find, even among the experts. Experts at the [employer] developed an idea known as the Healthy Eating Plate. Just imagine a plate divided into logical, healthy portions.   The emphasis is on diet quality:   Load up on vegetables and fruits - one-half of your plate: Aim for color and variety, and remember that potatoes dont count.   Go for whole grains - one-quarter of your plate: Whole wheat, barley, wheat berries, quinoa, oats, brown rice, and foods made with them. If you want  pasta, go with whole wheat pasta.   Protein power - one-quarter of your plate: Fish, chicken, beans, and nuts are all healthy, versatile protein sources. Limit red meat.   The diet, however, does go beyond the plate, offering a few other suggestions.   Use healthy  plant oils, such as olive, canola, soy, corn, sunflower and peanut. Check the labels, and avoid partially hydrogenated oil, which have unhealthy trans fats.   If youre thirsty, drink water. Coffee and tea are good in moderation, but skip sugary drinks and limit milk and dairy products to one or two daily servings.   The type of carbohydrate in the diet is more important than the amount. Some sources of carbohydrates, such as vegetables, fruits, whole grains, and beans-are healthier than others.   Finally, stay active  Signed, Berniece Salines, DO  06/04/2021 8:46 PM    [facility]

## 2021-07-24 DIAGNOSIS — E1169 Type 2 diabetes mellitus with other specified complication: Secondary | ICD-10-CM | POA: Diagnosis not present

## 2021-07-24 DIAGNOSIS — E782 Mixed hyperlipidemia: Secondary | ICD-10-CM | POA: Diagnosis not present

## 2021-07-24 DIAGNOSIS — M179 Osteoarthritis of knee, unspecified: Secondary | ICD-10-CM | POA: Diagnosis not present

## 2021-07-24 DIAGNOSIS — I10 Essential (primary) hypertension: Secondary | ICD-10-CM | POA: Diagnosis not present

## 2021-07-24 DIAGNOSIS — N400 Enlarged prostate without lower urinary tract symptoms: Secondary | ICD-10-CM | POA: Diagnosis not present

## 2021-07-24 DIAGNOSIS — N183 Chronic kidney disease, stage 3 unspecified: Secondary | ICD-10-CM | POA: Diagnosis not present

## 2021-08-09 DIAGNOSIS — Z03818 Encounter for observation for suspected exposure to other biological agents ruled out: Secondary | ICD-10-CM | POA: Diagnosis not present

## 2021-08-09 DIAGNOSIS — R0981 Nasal congestion: Secondary | ICD-10-CM | POA: Diagnosis not present

## 2021-08-09 DIAGNOSIS — J019 Acute sinusitis, unspecified: Secondary | ICD-10-CM | POA: Diagnosis not present

## 2021-08-22 DIAGNOSIS — H02135 Senile ectropion of left lower eyelid: Secondary | ICD-10-CM | POA: Diagnosis not present

## 2021-08-22 DIAGNOSIS — H02413 Mechanical ptosis of bilateral eyelids: Secondary | ICD-10-CM | POA: Diagnosis not present

## 2021-08-22 DIAGNOSIS — H16213 Exposure keratoconjunctivitis, bilateral: Secondary | ICD-10-CM | POA: Diagnosis not present

## 2021-08-22 DIAGNOSIS — H02412 Mechanical ptosis of left eyelid: Secondary | ICD-10-CM | POA: Diagnosis not present

## 2021-08-22 DIAGNOSIS — H04223 Epiphora due to insufficient drainage, bilateral lacrimal glands: Secondary | ICD-10-CM | POA: Diagnosis not present

## 2021-08-22 DIAGNOSIS — H02831 Dermatochalasis of right upper eyelid: Secondary | ICD-10-CM | POA: Diagnosis not present

## 2021-08-22 DIAGNOSIS — H02834 Dermatochalasis of left upper eyelid: Secondary | ICD-10-CM | POA: Diagnosis not present

## 2021-08-22 DIAGNOSIS — H57813 Brow ptosis, bilateral: Secondary | ICD-10-CM | POA: Diagnosis not present

## 2021-08-22 DIAGNOSIS — H02021 Mechanical entropion of right upper eyelid: Secondary | ICD-10-CM | POA: Diagnosis not present

## 2021-08-22 DIAGNOSIS — H02411 Mechanical ptosis of right eyelid: Secondary | ICD-10-CM | POA: Diagnosis not present

## 2021-08-22 DIAGNOSIS — H02024 Mechanical entropion of left upper eyelid: Secondary | ICD-10-CM | POA: Diagnosis not present

## 2021-08-22 DIAGNOSIS — H02132 Senile ectropion of right lower eyelid: Secondary | ICD-10-CM | POA: Diagnosis not present

## 2021-09-16 DIAGNOSIS — I10 Essential (primary) hypertension: Secondary | ICD-10-CM | POA: Diagnosis not present

## 2021-09-16 DIAGNOSIS — I509 Heart failure, unspecified: Secondary | ICD-10-CM | POA: Diagnosis not present

## 2021-09-16 DIAGNOSIS — M2550 Pain in unspecified joint: Secondary | ICD-10-CM | POA: Diagnosis not present

## 2021-09-16 DIAGNOSIS — R0981 Nasal congestion: Secondary | ICD-10-CM | POA: Diagnosis not present

## 2021-09-16 DIAGNOSIS — N183 Chronic kidney disease, stage 3 unspecified: Secondary | ICD-10-CM | POA: Diagnosis not present

## 2021-09-16 DIAGNOSIS — R609 Edema, unspecified: Secondary | ICD-10-CM | POA: Diagnosis not present

## 2021-09-16 DIAGNOSIS — E782 Mixed hyperlipidemia: Secondary | ICD-10-CM | POA: Diagnosis not present

## 2021-09-16 DIAGNOSIS — D500 Iron deficiency anemia secondary to blood loss (chronic): Secondary | ICD-10-CM | POA: Diagnosis not present

## 2021-09-16 DIAGNOSIS — Z0000 Encounter for general adult medical examination without abnormal findings: Secondary | ICD-10-CM | POA: Diagnosis not present

## 2021-09-16 DIAGNOSIS — N400 Enlarged prostate without lower urinary tract symptoms: Secondary | ICD-10-CM | POA: Diagnosis not present

## 2021-09-16 DIAGNOSIS — R21 Rash and other nonspecific skin eruption: Secondary | ICD-10-CM | POA: Diagnosis not present

## 2021-09-16 DIAGNOSIS — E1169 Type 2 diabetes mellitus with other specified complication: Secondary | ICD-10-CM | POA: Diagnosis not present

## 2021-09-30 ENCOUNTER — Other Ambulatory Visit: Payer: Self-pay

## 2021-09-30 ENCOUNTER — Ambulatory Visit (INDEPENDENT_AMBULATORY_CARE_PROVIDER_SITE_OTHER): Payer: Medicare Other | Admitting: Podiatrist

## 2021-09-30 ENCOUNTER — Encounter: Payer: Self-pay | Admitting: Podiatrist

## 2021-09-30 ENCOUNTER — Telehealth: Payer: Self-pay | Admitting: *Deleted

## 2021-09-30 DIAGNOSIS — E119 Type 2 diabetes mellitus without complications: Secondary | ICD-10-CM

## 2021-09-30 DIAGNOSIS — M79609 Pain in unspecified limb: Secondary | ICD-10-CM

## 2021-09-30 DIAGNOSIS — B351 Tinea unguium: Secondary | ICD-10-CM

## 2021-09-30 NOTE — Progress Notes (Signed)
?Chief Complaint  ?Patient presents with  ? Nail Problem  ?  Routine Diabetic foot care  ?  ?  ? ?HPI: Patient is 88 y.o. male who presents today for diabetic foot care.  He relates no subjective complaints of neuropathy. His nails are thick and painful and he is interested in treatment options.  ? ?Patient Active Problem List  ? Diagnosis Date Noted  ? Frequent PVCs 06/04/2021  ? PSVT (paroxysmal supraventricular tachycardia) ([place]) 06/04/2021  ? PAC (premature atrial contraction) 06/04/2021  ? Osteoarthritis of both knees 02/26/2021  ? Bilateral leg edema 11/01/2020  ? Murmur, cardiac 11/01/2020  ? PVC (premature ventricular contraction) 11/01/2020  ? Hypertension   ? Diabetes mellitus without complication ([place])   ? Arthritis   ? Bilateral lower extremity edema 10/29/2020  ? Baastrup's syndrome with lumbar spinal stenosis 07/03/2020  ? Baker's cyst, left 06/21/2018  ? Pseudogout of knee 04/27/2018  ? Primary osteoarthritis of both knees 02/24/2018  ? ? ?Current Outpatient Medications on File Prior to Visit  ?Medication Sig Dispense Refill  ? acetaminophen (TYLENOL) 650 MG CR tablet Take 650 mg by mouth every 8 (eight) hours as needed for pain. Take 1-2 every 8 hours as needed    ? cholecalciferol (VITAMIN D) 25 MCG (1000 UNIT) tablet Take 1,000 Units by mouth daily.    ? furosemide (LASIX) 40 MG tablet Take 1 tablet (40 mg total) by mouth daily. Take once on Tuesday and Friday. 90 tablet 3  ? metFORMIN (GLUCOPHAGE) 500 MG tablet Take 500 mg by mouth 2 (two) times daily with a meal.    ? metoprolol succinate (TOPROL XL) 25 MG 24 hr tablet Take 0.5 tablets (12.5 mg total) by mouth daily. 45 tablet 3  ? Multiple Vitamin (MULTIVITAMIN WITH MINERALS) TABS tablet Take 1 tablet by mouth daily.    ? Naphazoline-Pheniramine 0.027-0.315 % SOLN Place 1 drop into both eyes 3 (three) times daily as needed (dry/irritated/allergy eye relief). (Patient not taking: Reported on 06/04/2021)    ? oxymetazoline (AFRIN) 0.05 % nasal  spray Place 1 spray into both nostrils at bedtime as needed for congestion. (Patient not taking: Reported on 06/04/2021)    ? pioglitazone (ACTOS) 45 MG tablet Take 45 mg by mouth daily.    ? quinapril (ACCUPRIL) 10 MG tablet Take 10 mg by mouth daily.    ? tamsulosin (FLOMAX) 0.4 MG CAPS capsule Take 0.4 mg by mouth daily.    ? ?No current facility-administered medications on file prior to visit.  ? ? ?Allergies  ?Allergen Reactions  ? Nsaids Other (See Comments)  ? ? ?Review of Systems ?No fevers, chills, nausea, muscle aches, no difficulty breathing, no calf pain, no chest pain or shortness of breath. ? ? ?Physical Exam ? ?GENERAL APPEARANCE: Alert, conversant. Appropriately groomed. No acute distress.  ? ?VASCULAR: Pedal pulses palpable DP and PT bilateral.  Capillary refill time is immediate to all digits,  Proximal to distal cooling it warm to warm.  Digital perfusion adequate.  ? ?NEUROLOGIC: sensation is intact to 5.07 monofilament at 5/5 sites bilateral.  Light touch is intact bilateral, vibratory sensation intact bilateral ? ?MUSCULOSKELETAL: acceptable muscle strength, tone and stability bilateral.  No gross boney pedal deformities noted.  No pain, crepitus or limitation noted with foot and ankle range of motion bilateral. Swan neck deformity of the right second toe distal ip joint ? ?DERMATOLOGIC: skin is warm, supple, and dry.  No open lesions noted.  No rash, no pre ulcerative lesions. Digital  nails are thick, discolored, dystrophic, brittle with subungual debris present and clinically mycotic x 10.   ? ? ? ? ?Assessment  ? ?  ICD-10-CM   ?1. Diabetes mellitus without complication (HCC)  X65.5   ?  ?2. Pain due to onychomycosis of nail  B35.1   ? M79.609   ?  ? ? ? ?Plan ? ?Discussed exam findings with the patient.  Recommended a nail debridement.  This was carried out today with sterile nail nippers and a power burr without complication.  Periodic routine nail debridement recommended every 3 months or  as needed for follow-up.  ? ?Discussed topical vs. Oral options for treatment of the nail fungus.  The patient was notified of the risks and alternatives and wishes to try Lamisil.  I will have him fax over the most recent labs done at his primary care physician prior to initiating therapy.   ?

## 2021-09-30 NOTE — Patient Instructions (Addendum)
Terbinafine Tablets What is this medication? TERBINAFINE (TER bin a feen) treats fungal infections of the nails. It belongs to a group of medications called antifungals. It will not treat infections caused by bacteria or viruses. This medicine may be used for other purposes; ask your health care provider or pharmacist if you have questions. COMMON BRAND NAME(S): Lamisil, Terbinex What should I tell my care team before I take this medication? They need to know if you have any of these conditions: Liver disease An unusual or allergic reaction to terbinafine, other medications, foods, dyes, or preservatives Pregnant or trying to get pregnant Breast-feeding How should I use this medication? Take this medication by mouth with water. Take it as directed on the prescription label at the same time every day. You can take it with or without food. If it upsets your stomach, take it with food. Keep taking it unless your care team tells you to stop. A special MedGuide will be given to you by the pharmacist with each prescription and refill. Be sure to read this information carefully each time. Talk to your care team regarding the use of this medication in children. Special care may be needed. Overdosage: If you think you have taken too much of this medicine contact a poison control center or emergency room at once. NOTE: This medicine is only for you. Do not share this medicine with others. What if I miss a dose? If you miss a dose, take it as soon as you can unless it is more than 4 hours late. If it is more than 4 hours late, skip the missed dose. Take the next dose at the normal time. What may interact with this medication? Do not take this medication with any of the following: Pimozide Thioridazine This medication may also interact with the following: Beta blockers Caffeine Certain medications for mental health conditions Cimetidine Cyclosporine Medications for fungal infections like fluconazole  and ketoconazole Medications for irregular heartbeat like amiodarone, flecainide and propafenone Rifampin Warfarin This list may not describe all possible interactions. Give your health care provider a list of all the medicines, herbs, non-prescription drugs, or dietary supplements you use. Also tell them if you smoke, drink alcohol, or use illegal drugs. Some items may interact with your medicine. What should I watch for while using this medication? Visit your care team for regular checks on your progress. You may need blood work while you are taking this medication. It may be some time before you see the benefit from this medication. This medication may cause serious skin reactions. They can happen weeks to months after starting the medication. Contact your care team right away if you notice fevers or flu-like symptoms with a rash. The rash may be red or purple and then turn into blisters or peeling of the skin. Or, you might notice a red rash with swelling of the face, lips or lymph nodes in your neck or under your arms. This medication can make you more sensitive to the sun. Keep out of the sun, If you cannot avoid being in the sun, wear protective clothing and sunscreen. Do not use sun lamps or tanning beds/booths. What side effects may I notice from receiving this medication? Side effects that you should report to your care team as soon as possible: Allergic reactions--skin rash, itching, hives, swelling of the face, lips, tongue, or throat Change in sense of smell Change in taste Infection--fever, chills, cough, or sore throat Liver injury--right upper belly pain, loss of appetite, nausea,   light-colored stool, dark yellow or brown urine, yellowing skin or eyes, unusual weakness or fatigue ?Low red blood cell level--unusual weakness or fatigue, dizziness, headache, trouble breathing ?Lupus-like syndrome--joint pain, swelling, or stiffness, butterfly-shaped rash on the face, rashes that get worse  in the sun, fever, unusual weakness or fatigue ?Rash, fever, and swollen lymph nodes ?Redness, blistering, peeling, or loosening of the skin, including inside the mouth ?Unusual bruising or bleeding ?Worsening mood, feelings of depression ?Side effects that usually do not require medical attention (report to your care team if they continue or are bothersome): ?Diarrhea ?Gas ?Headache ?Nausea ?Stomach pain ?Upset stomach ?This list may not describe all possible side effects. Call your doctor for medical advice about side effects. You may report side effects to FDA at 1-800-FDA-1088. ?Where should I keep my medication? ?Keep out of the reach of children and pets. ?Store between 20 and 25 degrees C (68 and 77 degrees F). Protect from light. Get rid of any unused medication after the expiration date. ?To get rid of medications that are no longer needed or have expired: ?Take the medication to a medication take-back program. Check with your pharmacy or law enforcement to find a location. ?If you cannot return the medication, check the label or package insert to see if the medication should be thrown out in the garbage or flushed down the toilet. If you are not sure, ask your care team. If it is safe to put it in the trash, take the medication out of the container. Mix the medication with cat litter, dirt, coffee grounds, or other unwanted substance. Seal the mixture in a bag or container. Put it in the trash. ?NOTE: This sheet is a summary. It may not cover all possible information. If you have questions about this medicine, talk to your doctor, pharmacist, or health care provider. ?? 2022 Elsevier/Gold Standard (2021-02-13 00:00:00) ? ?

## 2021-09-30 NOTE — Telephone Encounter (Signed)
Patient is calling because a medication was supposed to be sent to pharmacy for fungus on nails. Please advise. ?

## 2021-10-03 ENCOUNTER — Other Ambulatory Visit: Payer: Self-pay | Admitting: Podiatrist

## 2021-10-03 MED ORDER — TERBINAFINE HCL 250 MG PO TABS: 250.0000 mg | ORAL_TABLET | Freq: Every day | ORAL | 0 refills | Status: DC

## 2021-10-03 NOTE — Telephone Encounter (Signed)
Called patient, spoke with the son and gave recommendations and that she has called in an antifungal medication for 30 days. ?The son said that his dad is not going to take the medicine, afraid it will damage his liver, they went back and forth because son has taken and his liver is fine but will not argue any more with him. ? ?

## 2021-10-03 NOTE — Telephone Encounter (Signed)
-----   Message from Bronson Ing, DPM sent at 10/03/2021 11:56 AM EDT ----- ?Regarding: antifungal medicine ?Hi Christop Hippert! ? ?Would you mind calling mr Danzy and let him know I called in the antifungal to his pharmacy ( the one at [facility]-  if you can also confirm that's the correct pharmacy) .  I just had to review his labs and they are looking good. ? ?Thanks!  Dr. Valentina Lucks ? ?

## 2022-01-01 ENCOUNTER — Encounter (HOSPITAL_BASED_OUTPATIENT_CLINIC_OR_DEPARTMENT_OTHER): Payer: Self-pay | Admitting: Emergency Medicine

## 2022-01-01 ENCOUNTER — Emergency Department (HOSPITAL_BASED_OUTPATIENT_CLINIC_OR_DEPARTMENT_OTHER): Payer: Medicare Other

## 2022-01-01 ENCOUNTER — Emergency Department (HOSPITAL_BASED_OUTPATIENT_CLINIC_OR_DEPARTMENT_OTHER)
Admission: EM | Admit: 2022-01-01 | Discharge: 2022-01-01 | Disposition: A | Discharge status: Home / Self Care | Payer: Medicare Other | Attending: Emergency Medicine | Admitting: Emergency Medicine

## 2022-01-01 DIAGNOSIS — Z79899 Other long term (current) drug therapy: Secondary | ICD-10-CM | POA: Diagnosis not present

## 2022-01-01 DIAGNOSIS — Z7984 Long term (current) use of oral hypoglycemic drugs: Secondary | ICD-10-CM | POA: Insufficient documentation

## 2022-01-01 DIAGNOSIS — Z20822 Contact with and (suspected) exposure to covid-19: Secondary | ICD-10-CM | POA: Insufficient documentation

## 2022-01-01 DIAGNOSIS — E119 Type 2 diabetes mellitus without complications: Secondary | ICD-10-CM | POA: Diagnosis not present

## 2022-01-01 DIAGNOSIS — I110 Hypertensive heart disease with heart failure: Secondary | ICD-10-CM | POA: Diagnosis not present

## 2022-01-01 DIAGNOSIS — R0981 Nasal congestion: Secondary | ICD-10-CM | POA: Insufficient documentation

## 2022-01-01 DIAGNOSIS — R0602 Shortness of breath: Secondary | ICD-10-CM | POA: Diagnosis not present

## 2022-01-01 DIAGNOSIS — R600 Localized edema: Secondary | ICD-10-CM | POA: Insufficient documentation

## 2022-01-01 DIAGNOSIS — I509 Heart failure, unspecified: Secondary | ICD-10-CM | POA: Insufficient documentation

## 2022-01-01 DIAGNOSIS — J9811 Atelectasis: Secondary | ICD-10-CM | POA: Diagnosis not present

## 2022-01-01 DIAGNOSIS — J3489 Other specified disorders of nose and nasal sinuses: Secondary | ICD-10-CM | POA: Diagnosis not present

## 2022-01-01 LAB — CBC WITH DIFFERENTIAL/PLATELET
Abs Immature Granulocytes: 0.05 10*3/uL (ref 0.00–0.07)
Basophils Absolute: 0.1 10*3/uL (ref 0.0–0.1)
Basophils Relative: 1 %
Eosinophils Absolute: 0.5 10*3/uL (ref 0.0–0.5)
Eosinophils Relative: 5 %
HCT: 36.8 % — ABNORMAL LOW (ref 39.0–52.0)
Hemoglobin: 12 g/dL — ABNORMAL LOW (ref 13.0–17.0)
Immature Granulocytes: 1 %
Lymphocytes Relative: 13 %
Lymphs Abs: 1.2 10*3/uL (ref 0.7–4.0)
MCH: 28.8 pg (ref 26.0–34.0)
MCHC: 32.6 g/dL (ref 30.0–36.0)
MCV: 88.2 fL (ref 80.0–100.0)
Monocytes Absolute: 0.8 10*3/uL (ref 0.1–1.0)
Monocytes Relative: 9 %
Neutro Abs: 6.5 10*3/uL (ref 1.7–7.7)
Neutrophils Relative %: 71 %
Platelets: 163 10*3/uL (ref 150–400)
RBC: 4.17 MIL/uL — ABNORMAL LOW (ref 4.22–5.81)
RDW: 15.6 % — ABNORMAL HIGH (ref 11.5–15.5)
WBC: 9 10*3/uL (ref 4.0–10.5)
nRBC: 0 % (ref 0.0–0.2)

## 2022-01-01 LAB — COMPREHENSIVE METABOLIC PANEL
ALT: 24 U/L (ref 0–44)
AST: 31 U/L (ref 15–41)
Albumin: 3.9 g/dL (ref 3.5–5.0)
Alkaline Phosphatase: 79 U/L (ref 38–126)
Anion gap: 8 (ref 5–15)
BUN: 27 mg/dL — ABNORMAL HIGH (ref 8–23)
CO2: 28 mmol/L (ref 22–32)
Calcium: 9.2 mg/dL (ref 8.9–10.3)
Chloride: 99 mmol/L (ref 98–111)
Creatinine, Ser: 1.41 mg/dL — ABNORMAL HIGH (ref 0.61–1.24)
GFR, Estimated: 46 mL/min — ABNORMAL LOW (ref >60)
Glucose, Bld: 218 mg/dL — ABNORMAL HIGH (ref 70–99)
Potassium: 4.2 mmol/L (ref 3.5–5.1)
Sodium: 135 mmol/L (ref 135–145)
Total Bilirubin: 0.8 mg/dL (ref 0.3–1.2)
Total Protein: 6.5 g/dL (ref 6.5–8.1)

## 2022-01-01 LAB — BRAIN NATRIURETIC PEPTIDE: B Natriuretic Peptide: 147.3 pg/mL — ABNORMAL HIGH (ref 0.0–100.0)

## 2022-01-01 LAB — TROPONIN I (HIGH SENSITIVITY): Troponin I (High Sensitivity): 13 ng/L (ref <18)

## 2022-01-01 LAB — SARS CORONAVIRUS 2 BY RT PCR: SARS Coronavirus 2 by RT PCR: NEGATIVE

## 2022-01-01 MED ORDER — DEXAMETHASONE 4 MG PO TABS
10.0000 mg | ORAL_TABLET | Freq: Once | ORAL | Status: AC
  Administered 2022-01-01: 10 mg via ORAL
  Filled 2022-01-01: qty 3

## 2022-01-01 MED ORDER — OXYMETAZOLINE HCL 0.05 % NA SOLN
1.0000 | Freq: Two times a day (BID) | NASAL | Status: DC | PRN
  Administered 2022-01-01: 1 via NASAL
  Filled 2022-01-01: qty 30

## 2022-01-01 NOTE — ED Triage Notes (Signed)
Pt w/ SHOB "for a couple months" per wife; having difficulty sleeping; "head stopped up"

## 2022-01-01 NOTE — ED Provider Notes (Signed)
[facility] EMERGENCY DEPARTMENT Provider Note   CSN: [id number] Arrival date & time: 01/01/22  1202     History  Chief Complaint  Patient presents with   Shortness of Breath    Darin Moran is a 88 y.o. male.  Patient is here with shortness of breath.  An issue the patient's been dealing with for several months but worse here recently.  Having to sit up in a recliner to sleep.  Some swelling in the legs.  Congestion at times.  Nothing makes it worse or better.  Patient uses wheelchair and walker.  Denies any chest pain.  Denies any fevers or chills.  Has a history of diabetes, heart failure, hypertension.  Denies any recent illness.  No weakness or numbness or nausea or vomiting.  Overall his main concern is for nasal congestion.  Having a hard time breathing through his nose.  The history is provided by the patient and a caregiver.       Home Medications Prior to Admission medications   Medication Sig Start Date End Date Taking? Authorizing Provider  acetaminophen (TYLENOL) 650 MG CR tablet Take 650 mg by mouth every 8 (eight) hours as needed for pain. Take 1-2 every 8 hours as needed    [provider]  cholecalciferol (VITAMIN D) 25 MCG (1000 UNIT) tablet Take 1,000 Units by mouth daily.    [provider]  furosemide (LASIX) 40 MG tablet Take 1 tablet (40 mg total) by mouth daily. Take once on Tuesday and Friday. 06/04/21 09/02/21  Tobb, Kardie, DO  metFORMIN (GLUCOPHAGE) 500 MG tablet Take 500 mg by mouth 2 (two) times daily with a meal.    [provider]  metoprolol succinate (TOPROL XL) 25 MG 24 hr tablet Take 0.5 tablets (12.5 mg total) by mouth daily. 06/04/21   Tobb, Godfrey Pick, DO  Multiple Vitamin (MULTIVITAMIN WITH MINERALS) TABS tablet Take 1 tablet by mouth daily.    [provider]  Naphazoline-Pheniramine 0.027-0.315 % SOLN Place 1 drop into both eyes 3 (three) times daily as needed (dry/irritated/allergy eye  relief). Patient not taking: Reported on 06/04/2021    [provider]  oxymetazoline (AFRIN) 0.05 % nasal spray Place 1 spray into both nostrils at bedtime as needed for congestion. Patient not taking: Reported on 06/04/2021    [provider]  pioglitazone (ACTOS) 45 MG tablet Take 45 mg by mouth daily.    [provider]  quinapril (ACCUPRIL) 10 MG tablet Take 10 mg by mouth daily.    [provider]  tamsulosin (FLOMAX) 0.4 MG CAPS capsule Take 0.4 mg by mouth daily. 10/19/19   [provider]      Allergies    Nsaids    Review of Systems   Review of Systems  Physical Exam Updated Vital Signs BP 139/76   Pulse 78   Temp 97.6 F (36.4 C) (Oral)   Resp 18   Wt 75.3 kg Comment: weighed on bed  SpO2 96%   BMI 24.51 kg/m  Physical Exam Vitals and nursing note reviewed.  Constitutional:      General: He is not in acute distress.    Appearance: He is well-developed.  HENT:     Head: Normocephalic and atraumatic.     Mouth/Throat:     Mouth: Mucous membranes are moist.  Eyes:     Conjunctiva/sclera: Conjunctivae normal.     Pupils: Pupils are equal, round, and reactive to light.  Cardiovascular:     Rate  and Rhythm: Normal rate and regular rhythm.     Pulses: Normal pulses.     Heart sounds: Normal heart sounds. No murmur heard. Pulmonary:     Effort: Pulmonary effort is normal. Tachypnea present. No respiratory distress.     Breath sounds: Decreased breath sounds present.  Abdominal:     Palpations: Abdomen is soft.     Tenderness: There is no abdominal tenderness.  Musculoskeletal:        General: No swelling.     Cervical back: Normal range of motion and neck supple.     Right lower leg: Edema (2+) present.     Left lower leg: Edema (2+) present.  Skin:    General: Skin is warm and dry.     Capillary Refill: Capillary refill takes less than 2 seconds.  Neurological:     General: No focal deficit present.     Mental  Status: He is alert.  Psychiatric:        Mood and Affect: Mood normal.     ED Results / Procedures / Treatments   Labs (all labs ordered are listed, but only abnormal results are displayed) Labs Reviewed  CBC WITH DIFFERENTIAL/PLATELET - Abnormal; Notable for the following components:      Result Value   RBC 4.17 (*)    Hemoglobin 12.0 (*)    HCT 36.8 (*)    RDW 15.6 (*)    All other components within normal limits  COMPREHENSIVE METABOLIC PANEL - Abnormal; Notable for the following components:   Glucose, Bld 218 (*)    BUN 27 (*)    Creatinine, Ser 1.41 (*)    GFR, Estimated 46 (*)    All other components within normal limits  BRAIN NATRIURETIC PEPTIDE - Abnormal; Notable for the following components:   B Natriuretic Peptide 147.3 (*)    All other components within normal limits  SARS CORONAVIRUS 2 BY RT PCR  TROPONIN I (HIGH SENSITIVITY)    EKG EKG Interpretation  Date/Time:  Wednesday January 01 2022 12:19:08 EDT Ventricular Rate:  83 PR Interval:  222 QRS Duration: 98 QT Interval:  372 QTC Calculation: 438 R Axis:   9 Text Interpretation: Sinus rhythm Ventricular premature complex Prolonged PR interval Confirmed by Lennice Sites ([id number]) on 01/01/2022 12:21:13 PM  Radiology DG Chest Portable 1 View  Result Date: 01/01/2022 CLINICAL DATA:  Short of breath EXAM: PORTABLE CHEST 1 VIEW COMPARISON:  None Available. FINDINGS: Elevated right hemidiaphragm with right lower lobe atelectasis. Mild left lower lobe atelectasis Negative for heart failure or effusion. IMPRESSION: Hypoventilation with bibasilar atelectasis right greater than left. Electronically Signed   By: Franchot Gallo M.D.   On: 01/01/2022 12:42    Procedures Procedures    Medications Ordered in ED Medications  dexamethasone (DECADRON) tablet 10 mg (has no administration in time range)  oxymetazoline (AFRIN) 0.05 % nasal spray 1 spray (has no administration in time range)    ED Course/ Medical Decision  Making/ A&P                           Medical Decision Making Amount and/or Complexity of Data Reviewed Labs: ordered. Radiology: ordered.  Risk OTC drugs. Prescription drug management.   Byron Peacock is here with shortness of breath.  History of heart failure, diabetes, hypertension.  Patient arrives hypertensive but otherwise normal vitals.  Mildly increased work of breathing, rhonchi on exam with bilateral pitting edema in his legs.  Overall differential diagnosis is heart failure exacerbation versus less likely ACS, pneumonia, electrolyte abnormality, viral process.  Will check chest x-ray for pneumonia.  We will get CBC, CMP, BNP, troponin, COVID testing.  Per my review and interpretation of labs there is no significant anemia, electrolyte abnormality, kidney injury, leukocytosis.  Troponin and BNP unremarkable.  Chest x-ray no evidence of volume overload or pneumonia.  COVID is negative.  Overall we will treat with Decadron for possibly URI/inflammatory process.  We will represcribe him Afrin for his nasal congestion which was his main concern for today's visit.  Overall discharged in good condition.  Understands return precautions.  Overall patient's main concern is for nasal congestion which is chronic.  He feels like that is where his difficulty is.  This chart was dictated using voice recognition software.  Despite best efforts to proofread,  errors can occur which can change the documentation meaning.         Final Clinical Impression(s) / ED Diagnoses Final diagnoses:  Shortness of breath  Nasal congestion    Rx / DC Orders ED Discharge Orders     None         Lennice Sites, DO 01/01/22 1321

## 2022-01-01 NOTE — Discharge Instructions (Signed)
Overall do believe maybe you have some chronic congestion, may be an upper respiratory infection.  There is no evidence of heart attack or heart failure or pneumonia on today's work-up.  I have treated you with a long-acting steroid to help with your nasal congestion.  I have also prescribed you Afrin to use to help with your congestion.  Recommend that you use the spray once or twice a day for no more than 3 days in a row.  Follow-up with your primary care doctor.

## 2022-01-23 ENCOUNTER — Emergency Department (HOSPITAL_COMMUNITY)
Admission: EM | Admit: 2022-01-23 | Discharge: 2022-01-23 | Disposition: A | Discharge status: Home / Self Care | Payer: Medicare Other | Attending: Emergency Medicine | Admitting: Emergency Medicine

## 2022-01-23 ENCOUNTER — Encounter (HOSPITAL_COMMUNITY): Payer: Self-pay

## 2022-01-23 ENCOUNTER — Other Ambulatory Visit: Payer: Self-pay

## 2022-01-23 DIAGNOSIS — L89159 Pressure ulcer of sacral region, unspecified stage: Secondary | ICD-10-CM | POA: Insufficient documentation

## 2022-01-23 NOTE — ED Triage Notes (Signed)
Pt presents to ED with complaints of "rash or bedsore" on buttocks, states has been there a while.

## 2022-01-23 NOTE — Discharge Instructions (Signed)
You were seen in the emergency department for evaluation of a wound on your buttocks.  This is a pressure injury likely from sitting prolonged amount of time.  We have put on a dressing to relieve some of the pressure.  We have also put in for home health consult.  You can try an eggcrate mattress to try to offload some of the pressure.  Follow-up with your regular doctor.  Return to the emergency department if any worsening or concerning symptoms

## 2022-01-23 NOTE — ED Provider Notes (Signed)
[facility], [place] EMERGENCY DEPARTMENT Provider Note   CSN: [id number] Arrival date & time: 01/23/22  1056     History  Chief Complaint  Patient presents with   Wound Check    Darin Moran is a 88 y.o. male.  He has a history of some arthritis and lumbar issues which causes him to be mostly wheelchair-bound.  He is brought in by his son after the his wife noticed some skin breakdown on his buttocks.  He said this has been a problem for over a year.  He spends most of his time in a recliner.  Not ambulatory at baseline although can do a little bit of transfer.  No fevers or abdominal pain.  Patient's main concern is a sinus congestion which sounds like a very chronic problem for him.  Son states there is no health services in the house.  The history is provided by the patient.  Wound Check This is a chronic problem. The problem occurs constantly. The problem has been gradually worsening. Pertinent negatives include no chest pain, no abdominal pain, no headaches and no shortness of breath. Exacerbated by: Sitting. Nothing relieves the symptoms. He has tried nothing for the symptoms. The treatment provided no relief.       Home Medications Prior to Admission medications   Medication Sig Start Date End Date Taking? Authorizing Provider  acetaminophen (TYLENOL) 650 MG CR tablet Take 650 mg by mouth every 8 (eight) hours as needed for pain. Take 1-2 every 8 hours as needed    [provider]  cholecalciferol (VITAMIN D) 25 MCG (1000 UNIT) tablet Take 1,000 Units by mouth daily.    [provider]  furosemide (LASIX) 40 MG tablet Take 1 tablet (40 mg total) by mouth daily. Take once on Tuesday and Friday. 06/04/21 09/02/21  Tobb, Kardie, DO  metFORMIN (GLUCOPHAGE) 500 MG tablet Take 500 mg by mouth 2 (two) times daily with a meal.    [provider]  metoprolol succinate (TOPROL XL) 25 MG 24 hr tablet Take 0.5 tablets (12.5 mg total) by mouth daily. 06/04/21   Tobb,  Godfrey Pick, DO  Multiple Vitamin (MULTIVITAMIN WITH MINERALS) TABS tablet Take 1 tablet by mouth daily.    [provider]  Naphazoline-Pheniramine 0.027-0.315 % SOLN Place 1 drop into both eyes 3 (three) times daily as needed (dry/irritated/allergy eye relief). Patient not taking: Reported on 06/04/2021    [provider]  oxymetazoline (AFRIN) 0.05 % nasal spray Place 1 spray into both nostrils at bedtime as needed for congestion. Patient not taking: Reported on 06/04/2021    [provider]  pioglitazone (ACTOS) 45 MG tablet Take 45 mg by mouth daily.    [provider]  quinapril (ACCUPRIL) 10 MG tablet Take 10 mg by mouth daily.    [provider]  tamsulosin (FLOMAX) 0.4 MG CAPS capsule Take 0.4 mg by mouth daily. 10/19/19   [provider]      Allergies    Nsaids    Review of Systems   Review of Systems  HENT:  Positive for rhinorrhea and sinus pressure.   Respiratory:  Negative for shortness of breath.   Cardiovascular:  Negative for chest pain.  Gastrointestinal:  Negative for abdominal pain.  Skin:  Positive for rash and wound.  Neurological:  Negative for headaches.    Physical Exam Updated Vital Signs BP (!) 152/79 (BP Location: Left Arm)   Pulse 94   Temp (!) 97.5 F (36.4 C) (Oral)  Resp 18   Ht '5\' 11"'$  (1.803 m)   Wt 75 kg   SpO2 94%   BMI 23.06 kg/m  Physical Exam Vitals and nursing note reviewed.  Constitutional:      General: He is not in acute distress.    Appearance: Normal appearance. He is well-developed.  HENT:     Head: Normocephalic and atraumatic.  Eyes:     Conjunctiva/sclera: Conjunctivae normal.  Cardiovascular:     Rate and Rhythm: Normal rate and regular rhythm.     Heart sounds: No murmur heard. Pulmonary:     Effort: Pulmonary effort is normal. No respiratory distress.     Breath sounds: Normal breath sounds.  Abdominal:     Palpations: Abdomen is soft.     Tenderness: There is no  abdominal tenderness. There is no guarding or rebound.  Genitourinary:    Comments: He has some purpleish discoloration over his sacrum and a approximately 2 cm area of some skin breakdown.  Superficial.  No induration. Musculoskeletal:        General: No swelling.     Cervical back: Neck supple.  Skin:    General: Skin is warm and dry.     Capillary Refill: Capillary refill takes less than 2 seconds.  Neurological:     Mental Status: He is alert.     ED Results / Procedures / Treatments   Labs (all labs ordered are listed, but only abnormal results are displayed) Labs Reviewed - No data to display  EKG None  Radiology No results found.  Procedures Procedures    Medications Ordered in ED Medications - No data to display  ED Course/ Medical Decision Making/ A&P                           Medical Decision Making  Patient here for evaluation of a wound on his sacral area.  On exam he has signs of sacral decubitus pressure injury with a small breakdown of skin.  A foam pad was placed to protect the area.  Have placed a consult to social work regarding home health.  Return instructions discussed.        Final Clinical Impression(s) / ED Diagnoses Final diagnoses:  Pressure injury of skin of sacral region, unspecified injury stage    Rx / DC Orders ED Discharge Orders     None         Hayden Rasmussen, MD 01/23/22 1911

## 2022-01-30 DIAGNOSIS — Z993 Dependence on wheelchair: Secondary | ICD-10-CM | POA: Diagnosis not present

## 2022-01-30 DIAGNOSIS — D500 Iron deficiency anemia secondary to blood loss (chronic): Secondary | ICD-10-CM | POA: Diagnosis not present

## 2022-01-30 DIAGNOSIS — I509 Heart failure, unspecified: Secondary | ICD-10-CM | POA: Diagnosis not present

## 2022-01-30 DIAGNOSIS — E782 Mixed hyperlipidemia: Secondary | ICD-10-CM | POA: Diagnosis not present

## 2022-01-30 DIAGNOSIS — J309 Allergic rhinitis, unspecified: Secondary | ICD-10-CM | POA: Diagnosis not present

## 2022-01-30 DIAGNOSIS — N400 Enlarged prostate without lower urinary tract symptoms: Secondary | ICD-10-CM | POA: Diagnosis not present

## 2022-01-30 DIAGNOSIS — Z7984 Long term (current) use of oral hypoglycemic drugs: Secondary | ICD-10-CM | POA: Diagnosis not present

## 2022-01-30 DIAGNOSIS — M11869 Other specified crystal arthropathies, unspecified knee: Secondary | ICD-10-CM | POA: Diagnosis not present

## 2022-01-30 DIAGNOSIS — E1169 Type 2 diabetes mellitus with other specified complication: Secondary | ICD-10-CM | POA: Diagnosis not present

## 2022-01-30 DIAGNOSIS — H9190 Unspecified hearing loss, unspecified ear: Secondary | ICD-10-CM | POA: Diagnosis not present

## 2022-01-30 DIAGNOSIS — R0981 Nasal congestion: Secondary | ICD-10-CM | POA: Diagnosis not present

## 2022-01-30 DIAGNOSIS — I130 Hypertensive heart and chronic kidney disease with heart failure and stage 1 through stage 4 chronic kidney disease, or unspecified chronic kidney disease: Secondary | ICD-10-CM | POA: Diagnosis not present

## 2022-01-30 DIAGNOSIS — R21 Rash and other nonspecific skin eruption: Secondary | ICD-10-CM | POA: Diagnosis not present

## 2022-01-30 DIAGNOSIS — L89151 Pressure ulcer of sacral region, stage 1: Secondary | ICD-10-CM | POA: Diagnosis not present

## 2022-01-30 DIAGNOSIS — Z87891 Personal history of nicotine dependence: Secondary | ICD-10-CM | POA: Diagnosis not present

## 2022-01-30 DIAGNOSIS — M179 Osteoarthritis of knee, unspecified: Secondary | ICD-10-CM | POA: Diagnosis not present

## 2022-01-30 DIAGNOSIS — E1122 Type 2 diabetes mellitus with diabetic chronic kidney disease: Secondary | ICD-10-CM | POA: Diagnosis not present

## 2022-01-30 DIAGNOSIS — N183 Chronic kidney disease, stage 3 unspecified: Secondary | ICD-10-CM | POA: Diagnosis not present

## 2022-01-30 DIAGNOSIS — Z85828 Personal history of other malignant neoplasm of skin: Secondary | ICD-10-CM | POA: Diagnosis not present

## 2022-02-03 DIAGNOSIS — J309 Allergic rhinitis, unspecified: Secondary | ICD-10-CM | POA: Diagnosis not present

## 2022-02-03 DIAGNOSIS — L89151 Pressure ulcer of sacral region, stage 1: Secondary | ICD-10-CM | POA: Diagnosis not present

## 2022-02-03 DIAGNOSIS — I130 Hypertensive heart and chronic kidney disease with heart failure and stage 1 through stage 4 chronic kidney disease, or unspecified chronic kidney disease: Secondary | ICD-10-CM | POA: Diagnosis not present

## 2022-02-03 DIAGNOSIS — N400 Enlarged prostate without lower urinary tract symptoms: Secondary | ICD-10-CM | POA: Diagnosis not present

## 2022-02-03 DIAGNOSIS — E1122 Type 2 diabetes mellitus with diabetic chronic kidney disease: Secondary | ICD-10-CM | POA: Diagnosis not present

## 2022-02-03 DIAGNOSIS — I509 Heart failure, unspecified: Secondary | ICD-10-CM | POA: Diagnosis not present

## 2022-02-05 DIAGNOSIS — I509 Heart failure, unspecified: Secondary | ICD-10-CM | POA: Diagnosis not present

## 2022-02-05 DIAGNOSIS — N400 Enlarged prostate without lower urinary tract symptoms: Secondary | ICD-10-CM | POA: Diagnosis not present

## 2022-02-05 DIAGNOSIS — J309 Allergic rhinitis, unspecified: Secondary | ICD-10-CM | POA: Diagnosis not present

## 2022-02-05 DIAGNOSIS — I130 Hypertensive heart and chronic kidney disease with heart failure and stage 1 through stage 4 chronic kidney disease, or unspecified chronic kidney disease: Secondary | ICD-10-CM | POA: Diagnosis not present

## 2022-02-05 DIAGNOSIS — L89151 Pressure ulcer of sacral region, stage 1: Secondary | ICD-10-CM | POA: Diagnosis not present

## 2022-02-05 DIAGNOSIS — E1122 Type 2 diabetes mellitus with diabetic chronic kidney disease: Secondary | ICD-10-CM | POA: Diagnosis not present

## 2022-02-10 DIAGNOSIS — J309 Allergic rhinitis, unspecified: Secondary | ICD-10-CM | POA: Diagnosis not present

## 2022-02-10 DIAGNOSIS — L89151 Pressure ulcer of sacral region, stage 1: Secondary | ICD-10-CM | POA: Diagnosis not present

## 2022-02-10 DIAGNOSIS — N400 Enlarged prostate without lower urinary tract symptoms: Secondary | ICD-10-CM | POA: Diagnosis not present

## 2022-02-10 DIAGNOSIS — I509 Heart failure, unspecified: Secondary | ICD-10-CM | POA: Diagnosis not present

## 2022-02-10 DIAGNOSIS — I130 Hypertensive heart and chronic kidney disease with heart failure and stage 1 through stage 4 chronic kidney disease, or unspecified chronic kidney disease: Secondary | ICD-10-CM | POA: Diagnosis not present

## 2022-02-10 DIAGNOSIS — E1122 Type 2 diabetes mellitus with diabetic chronic kidney disease: Secondary | ICD-10-CM | POA: Diagnosis not present

## 2022-02-13 DIAGNOSIS — I130 Hypertensive heart and chronic kidney disease with heart failure and stage 1 through stage 4 chronic kidney disease, or unspecified chronic kidney disease: Secondary | ICD-10-CM | POA: Diagnosis not present

## 2022-02-13 DIAGNOSIS — I509 Heart failure, unspecified: Secondary | ICD-10-CM | POA: Diagnosis not present

## 2022-02-13 DIAGNOSIS — J309 Allergic rhinitis, unspecified: Secondary | ICD-10-CM | POA: Diagnosis not present

## 2022-02-13 DIAGNOSIS — E1122 Type 2 diabetes mellitus with diabetic chronic kidney disease: Secondary | ICD-10-CM | POA: Diagnosis not present

## 2022-02-13 DIAGNOSIS — L89151 Pressure ulcer of sacral region, stage 1: Secondary | ICD-10-CM | POA: Diagnosis not present

## 2022-02-13 DIAGNOSIS — N400 Enlarged prostate without lower urinary tract symptoms: Secondary | ICD-10-CM | POA: Diagnosis not present

## 2022-02-17 DIAGNOSIS — I130 Hypertensive heart and chronic kidney disease with heart failure and stage 1 through stage 4 chronic kidney disease, or unspecified chronic kidney disease: Secondary | ICD-10-CM | POA: Diagnosis not present

## 2022-02-17 DIAGNOSIS — I509 Heart failure, unspecified: Secondary | ICD-10-CM | POA: Diagnosis not present

## 2022-02-17 DIAGNOSIS — L89151 Pressure ulcer of sacral region, stage 1: Secondary | ICD-10-CM | POA: Diagnosis not present

## 2022-02-17 DIAGNOSIS — J309 Allergic rhinitis, unspecified: Secondary | ICD-10-CM | POA: Diagnosis not present

## 2022-02-17 DIAGNOSIS — E1122 Type 2 diabetes mellitus with diabetic chronic kidney disease: Secondary | ICD-10-CM | POA: Diagnosis not present

## 2022-02-17 DIAGNOSIS — N400 Enlarged prostate without lower urinary tract symptoms: Secondary | ICD-10-CM | POA: Diagnosis not present

## 2022-02-20 DIAGNOSIS — E1122 Type 2 diabetes mellitus with diabetic chronic kidney disease: Secondary | ICD-10-CM | POA: Diagnosis not present

## 2022-02-20 DIAGNOSIS — I130 Hypertensive heart and chronic kidney disease with heart failure and stage 1 through stage 4 chronic kidney disease, or unspecified chronic kidney disease: Secondary | ICD-10-CM | POA: Diagnosis not present

## 2022-02-20 DIAGNOSIS — J309 Allergic rhinitis, unspecified: Secondary | ICD-10-CM | POA: Diagnosis not present

## 2022-02-20 DIAGNOSIS — L89151 Pressure ulcer of sacral region, stage 1: Secondary | ICD-10-CM | POA: Diagnosis not present

## 2022-02-20 DIAGNOSIS — N400 Enlarged prostate without lower urinary tract symptoms: Secondary | ICD-10-CM | POA: Diagnosis not present

## 2022-02-20 DIAGNOSIS — I509 Heart failure, unspecified: Secondary | ICD-10-CM | POA: Diagnosis not present

## 2022-03-19 DIAGNOSIS — L89301 Pressure ulcer of unspecified buttock, stage 1: Secondary | ICD-10-CM | POA: Diagnosis not present

## 2022-03-19 DIAGNOSIS — R609 Edema, unspecified: Secondary | ICD-10-CM | POA: Diagnosis not present

## 2022-03-19 DIAGNOSIS — W19XXXA Unspecified fall, initial encounter: Secondary | ICD-10-CM | POA: Diagnosis not present

## 2022-03-19 DIAGNOSIS — M1711 Unilateral primary osteoarthritis, right knee: Secondary | ICD-10-CM | POA: Diagnosis not present

## 2022-03-19 DIAGNOSIS — E1169 Type 2 diabetes mellitus with other specified complication: Secondary | ICD-10-CM | POA: Diagnosis not present

## 2022-03-22 DIAGNOSIS — N183 Chronic kidney disease, stage 3 unspecified: Secondary | ICD-10-CM | POA: Diagnosis not present

## 2022-03-22 DIAGNOSIS — M11869 Other specified crystal arthropathies, unspecified knee: Secondary | ICD-10-CM | POA: Diagnosis not present

## 2022-03-22 DIAGNOSIS — J309 Allergic rhinitis, unspecified: Secondary | ICD-10-CM | POA: Diagnosis not present

## 2022-03-22 DIAGNOSIS — E1169 Type 2 diabetes mellitus with other specified complication: Secondary | ICD-10-CM | POA: Diagnosis not present

## 2022-03-22 DIAGNOSIS — D500 Iron deficiency anemia secondary to blood loss (chronic): Secondary | ICD-10-CM | POA: Diagnosis not present

## 2022-03-22 DIAGNOSIS — N400 Enlarged prostate without lower urinary tract symptoms: Secondary | ICD-10-CM | POA: Diagnosis not present

## 2022-03-22 DIAGNOSIS — H9190 Unspecified hearing loss, unspecified ear: Secondary | ICD-10-CM | POA: Diagnosis not present

## 2022-03-22 DIAGNOSIS — I130 Hypertensive heart and chronic kidney disease with heart failure and stage 1 through stage 4 chronic kidney disease, or unspecified chronic kidney disease: Secondary | ICD-10-CM | POA: Diagnosis not present

## 2022-03-22 DIAGNOSIS — E782 Mixed hyperlipidemia: Secondary | ICD-10-CM | POA: Diagnosis not present

## 2022-03-22 DIAGNOSIS — M1711 Unilateral primary osteoarthritis, right knee: Secondary | ICD-10-CM | POA: Diagnosis not present

## 2022-03-22 DIAGNOSIS — Z7984 Long term (current) use of oral hypoglycemic drugs: Secondary | ICD-10-CM | POA: Diagnosis not present

## 2022-03-22 DIAGNOSIS — Z9181 History of falling: Secondary | ICD-10-CM | POA: Diagnosis not present

## 2022-03-22 DIAGNOSIS — I509 Heart failure, unspecified: Secondary | ICD-10-CM | POA: Diagnosis not present

## 2022-03-25 DIAGNOSIS — N400 Enlarged prostate without lower urinary tract symptoms: Secondary | ICD-10-CM | POA: Diagnosis not present

## 2022-03-25 DIAGNOSIS — J309 Allergic rhinitis, unspecified: Secondary | ICD-10-CM | POA: Diagnosis not present

## 2022-03-25 DIAGNOSIS — N183 Chronic kidney disease, stage 3 unspecified: Secondary | ICD-10-CM | POA: Diagnosis not present

## 2022-03-25 DIAGNOSIS — M1711 Unilateral primary osteoarthritis, right knee: Secondary | ICD-10-CM | POA: Diagnosis not present

## 2022-03-25 DIAGNOSIS — I130 Hypertensive heart and chronic kidney disease with heart failure and stage 1 through stage 4 chronic kidney disease, or unspecified chronic kidney disease: Secondary | ICD-10-CM | POA: Diagnosis not present

## 2022-03-25 DIAGNOSIS — I509 Heart failure, unspecified: Secondary | ICD-10-CM | POA: Diagnosis not present

## 2022-03-29 DIAGNOSIS — I509 Heart failure, unspecified: Secondary | ICD-10-CM | POA: Diagnosis not present

## 2022-03-29 DIAGNOSIS — I130 Hypertensive heart and chronic kidney disease with heart failure and stage 1 through stage 4 chronic kidney disease, or unspecified chronic kidney disease: Secondary | ICD-10-CM | POA: Diagnosis not present

## 2022-03-29 DIAGNOSIS — J309 Allergic rhinitis, unspecified: Secondary | ICD-10-CM | POA: Diagnosis not present

## 2022-03-29 DIAGNOSIS — N183 Chronic kidney disease, stage 3 unspecified: Secondary | ICD-10-CM | POA: Diagnosis not present

## 2022-03-29 DIAGNOSIS — M1711 Unilateral primary osteoarthritis, right knee: Secondary | ICD-10-CM | POA: Diagnosis not present

## 2022-03-29 DIAGNOSIS — N400 Enlarged prostate without lower urinary tract symptoms: Secondary | ICD-10-CM | POA: Diagnosis not present

## 2022-04-02 DIAGNOSIS — I130 Hypertensive heart and chronic kidney disease with heart failure and stage 1 through stage 4 chronic kidney disease, or unspecified chronic kidney disease: Secondary | ICD-10-CM | POA: Diagnosis not present

## 2022-04-02 DIAGNOSIS — N400 Enlarged prostate without lower urinary tract symptoms: Secondary | ICD-10-CM | POA: Diagnosis not present

## 2022-04-02 DIAGNOSIS — J309 Allergic rhinitis, unspecified: Secondary | ICD-10-CM | POA: Diagnosis not present

## 2022-04-02 DIAGNOSIS — N183 Chronic kidney disease, stage 3 unspecified: Secondary | ICD-10-CM | POA: Diagnosis not present

## 2022-04-02 DIAGNOSIS — I509 Heart failure, unspecified: Secondary | ICD-10-CM | POA: Diagnosis not present

## 2022-04-02 DIAGNOSIS — M1711 Unilateral primary osteoarthritis, right knee: Secondary | ICD-10-CM | POA: Diagnosis not present

## 2022-04-03 DIAGNOSIS — I130 Hypertensive heart and chronic kidney disease with heart failure and stage 1 through stage 4 chronic kidney disease, or unspecified chronic kidney disease: Secondary | ICD-10-CM | POA: Diagnosis not present

## 2022-04-03 DIAGNOSIS — N183 Chronic kidney disease, stage 3 unspecified: Secondary | ICD-10-CM | POA: Diagnosis not present

## 2022-04-03 DIAGNOSIS — I509 Heart failure, unspecified: Secondary | ICD-10-CM | POA: Diagnosis not present

## 2022-04-03 DIAGNOSIS — M1711 Unilateral primary osteoarthritis, right knee: Secondary | ICD-10-CM | POA: Diagnosis not present

## 2022-04-03 DIAGNOSIS — N400 Enlarged prostate without lower urinary tract symptoms: Secondary | ICD-10-CM | POA: Diagnosis not present

## 2022-04-03 DIAGNOSIS — J309 Allergic rhinitis, unspecified: Secondary | ICD-10-CM | POA: Diagnosis not present

## 2022-04-04 DIAGNOSIS — M1711 Unilateral primary osteoarthritis, right knee: Secondary | ICD-10-CM | POA: Diagnosis not present

## 2022-04-04 DIAGNOSIS — I509 Heart failure, unspecified: Secondary | ICD-10-CM | POA: Diagnosis not present

## 2022-04-04 DIAGNOSIS — J309 Allergic rhinitis, unspecified: Secondary | ICD-10-CM | POA: Diagnosis not present

## 2022-04-04 DIAGNOSIS — I130 Hypertensive heart and chronic kidney disease with heart failure and stage 1 through stage 4 chronic kidney disease, or unspecified chronic kidney disease: Secondary | ICD-10-CM | POA: Diagnosis not present

## 2022-04-04 DIAGNOSIS — N400 Enlarged prostate without lower urinary tract symptoms: Secondary | ICD-10-CM | POA: Diagnosis not present

## 2022-04-04 DIAGNOSIS — N183 Chronic kidney disease, stage 3 unspecified: Secondary | ICD-10-CM | POA: Diagnosis not present

## 2022-04-07 DIAGNOSIS — R42 Dizziness and giddiness: Secondary | ICD-10-CM | POA: Diagnosis not present

## 2022-04-07 DIAGNOSIS — R739 Hyperglycemia, unspecified: Secondary | ICD-10-CM | POA: Diagnosis not present

## 2022-04-08 DIAGNOSIS — N400 Enlarged prostate without lower urinary tract symptoms: Secondary | ICD-10-CM | POA: Diagnosis not present

## 2022-04-08 DIAGNOSIS — I130 Hypertensive heart and chronic kidney disease with heart failure and stage 1 through stage 4 chronic kidney disease, or unspecified chronic kidney disease: Secondary | ICD-10-CM | POA: Diagnosis not present

## 2022-04-08 DIAGNOSIS — J309 Allergic rhinitis, unspecified: Secondary | ICD-10-CM | POA: Diagnosis not present

## 2022-04-08 DIAGNOSIS — I509 Heart failure, unspecified: Secondary | ICD-10-CM | POA: Diagnosis not present

## 2022-04-08 DIAGNOSIS — M1711 Unilateral primary osteoarthritis, right knee: Secondary | ICD-10-CM | POA: Diagnosis not present

## 2022-04-08 DIAGNOSIS — N183 Chronic kidney disease, stage 3 unspecified: Secondary | ICD-10-CM | POA: Diagnosis not present

## 2022-04-09 DIAGNOSIS — I130 Hypertensive heart and chronic kidney disease with heart failure and stage 1 through stage 4 chronic kidney disease, or unspecified chronic kidney disease: Secondary | ICD-10-CM | POA: Diagnosis not present

## 2022-04-09 DIAGNOSIS — N183 Chronic kidney disease, stage 3 unspecified: Secondary | ICD-10-CM | POA: Diagnosis not present

## 2022-04-09 DIAGNOSIS — M1711 Unilateral primary osteoarthritis, right knee: Secondary | ICD-10-CM | POA: Diagnosis not present

## 2022-04-09 DIAGNOSIS — N400 Enlarged prostate without lower urinary tract symptoms: Secondary | ICD-10-CM | POA: Diagnosis not present

## 2022-04-09 DIAGNOSIS — J309 Allergic rhinitis, unspecified: Secondary | ICD-10-CM | POA: Diagnosis not present

## 2022-04-09 DIAGNOSIS — I509 Heart failure, unspecified: Secondary | ICD-10-CM | POA: Diagnosis not present

## 2022-04-10 DIAGNOSIS — M1711 Unilateral primary osteoarthritis, right knee: Secondary | ICD-10-CM | POA: Diagnosis not present

## 2022-04-10 DIAGNOSIS — N183 Chronic kidney disease, stage 3 unspecified: Secondary | ICD-10-CM | POA: Diagnosis not present

## 2022-04-10 DIAGNOSIS — J309 Allergic rhinitis, unspecified: Secondary | ICD-10-CM | POA: Diagnosis not present

## 2022-04-10 DIAGNOSIS — N400 Enlarged prostate without lower urinary tract symptoms: Secondary | ICD-10-CM | POA: Diagnosis not present

## 2022-04-10 DIAGNOSIS — I509 Heart failure, unspecified: Secondary | ICD-10-CM | POA: Diagnosis not present

## 2022-04-10 DIAGNOSIS — I130 Hypertensive heart and chronic kidney disease with heart failure and stage 1 through stage 4 chronic kidney disease, or unspecified chronic kidney disease: Secondary | ICD-10-CM | POA: Diagnosis not present

## 2022-04-11 DIAGNOSIS — N400 Enlarged prostate without lower urinary tract symptoms: Secondary | ICD-10-CM | POA: Diagnosis not present

## 2022-04-11 DIAGNOSIS — I509 Heart failure, unspecified: Secondary | ICD-10-CM | POA: Diagnosis not present

## 2022-04-11 DIAGNOSIS — I130 Hypertensive heart and chronic kidney disease with heart failure and stage 1 through stage 4 chronic kidney disease, or unspecified chronic kidney disease: Secondary | ICD-10-CM | POA: Diagnosis not present

## 2022-04-11 DIAGNOSIS — J309 Allergic rhinitis, unspecified: Secondary | ICD-10-CM | POA: Diagnosis not present

## 2022-04-11 DIAGNOSIS — M1711 Unilateral primary osteoarthritis, right knee: Secondary | ICD-10-CM | POA: Diagnosis not present

## 2022-04-11 DIAGNOSIS — N183 Chronic kidney disease, stage 3 unspecified: Secondary | ICD-10-CM | POA: Diagnosis not present

## 2022-04-14 DIAGNOSIS — I130 Hypertensive heart and chronic kidney disease with heart failure and stage 1 through stage 4 chronic kidney disease, or unspecified chronic kidney disease: Secondary | ICD-10-CM | POA: Diagnosis not present

## 2022-04-14 DIAGNOSIS — M1711 Unilateral primary osteoarthritis, right knee: Secondary | ICD-10-CM | POA: Diagnosis not present

## 2022-04-14 DIAGNOSIS — I509 Heart failure, unspecified: Secondary | ICD-10-CM | POA: Diagnosis not present

## 2022-04-14 DIAGNOSIS — N400 Enlarged prostate without lower urinary tract symptoms: Secondary | ICD-10-CM | POA: Diagnosis not present

## 2022-04-14 DIAGNOSIS — N183 Chronic kidney disease, stage 3 unspecified: Secondary | ICD-10-CM | POA: Diagnosis not present

## 2022-04-14 DIAGNOSIS — J309 Allergic rhinitis, unspecified: Secondary | ICD-10-CM | POA: Diagnosis not present

## 2022-04-15 DIAGNOSIS — J309 Allergic rhinitis, unspecified: Secondary | ICD-10-CM | POA: Diagnosis not present

## 2022-04-15 DIAGNOSIS — N183 Chronic kidney disease, stage 3 unspecified: Secondary | ICD-10-CM | POA: Diagnosis not present

## 2022-04-15 DIAGNOSIS — N400 Enlarged prostate without lower urinary tract symptoms: Secondary | ICD-10-CM | POA: Diagnosis not present

## 2022-04-15 DIAGNOSIS — M1711 Unilateral primary osteoarthritis, right knee: Secondary | ICD-10-CM | POA: Diagnosis not present

## 2022-04-15 DIAGNOSIS — I130 Hypertensive heart and chronic kidney disease with heart failure and stage 1 through stage 4 chronic kidney disease, or unspecified chronic kidney disease: Secondary | ICD-10-CM | POA: Diagnosis not present

## 2022-04-15 DIAGNOSIS — I509 Heart failure, unspecified: Secondary | ICD-10-CM | POA: Diagnosis not present

## 2022-04-17 DIAGNOSIS — N400 Enlarged prostate without lower urinary tract symptoms: Secondary | ICD-10-CM | POA: Diagnosis not present

## 2022-04-17 DIAGNOSIS — N183 Chronic kidney disease, stage 3 unspecified: Secondary | ICD-10-CM | POA: Diagnosis not present

## 2022-04-17 DIAGNOSIS — I130 Hypertensive heart and chronic kidney disease with heart failure and stage 1 through stage 4 chronic kidney disease, or unspecified chronic kidney disease: Secondary | ICD-10-CM | POA: Diagnosis not present

## 2022-04-17 DIAGNOSIS — I509 Heart failure, unspecified: Secondary | ICD-10-CM | POA: Diagnosis not present

## 2022-04-17 DIAGNOSIS — M1711 Unilateral primary osteoarthritis, right knee: Secondary | ICD-10-CM | POA: Diagnosis not present

## 2022-04-17 DIAGNOSIS — J309 Allergic rhinitis, unspecified: Secondary | ICD-10-CM | POA: Diagnosis not present

## 2022-04-18 ENCOUNTER — Other Ambulatory Visit: Payer: Self-pay | Admitting: Cardiology

## 2022-04-18 DIAGNOSIS — I509 Heart failure, unspecified: Secondary | ICD-10-CM | POA: Diagnosis not present

## 2022-04-18 DIAGNOSIS — N183 Chronic kidney disease, stage 3 unspecified: Secondary | ICD-10-CM | POA: Diagnosis not present

## 2022-04-18 DIAGNOSIS — M1711 Unilateral primary osteoarthritis, right knee: Secondary | ICD-10-CM | POA: Diagnosis not present

## 2022-04-18 DIAGNOSIS — I130 Hypertensive heart and chronic kidney disease with heart failure and stage 1 through stage 4 chronic kidney disease, or unspecified chronic kidney disease: Secondary | ICD-10-CM | POA: Diagnosis not present

## 2022-04-18 DIAGNOSIS — J309 Allergic rhinitis, unspecified: Secondary | ICD-10-CM | POA: Diagnosis not present

## 2022-04-18 DIAGNOSIS — N400 Enlarged prostate without lower urinary tract symptoms: Secondary | ICD-10-CM | POA: Diagnosis not present

## 2022-04-19 DIAGNOSIS — J309 Allergic rhinitis, unspecified: Secondary | ICD-10-CM | POA: Diagnosis not present

## 2022-04-19 DIAGNOSIS — I509 Heart failure, unspecified: Secondary | ICD-10-CM | POA: Diagnosis not present

## 2022-04-19 DIAGNOSIS — I130 Hypertensive heart and chronic kidney disease with heart failure and stage 1 through stage 4 chronic kidney disease, or unspecified chronic kidney disease: Secondary | ICD-10-CM | POA: Diagnosis not present

## 2022-04-19 DIAGNOSIS — N400 Enlarged prostate without lower urinary tract symptoms: Secondary | ICD-10-CM | POA: Diagnosis not present

## 2022-04-19 DIAGNOSIS — N183 Chronic kidney disease, stage 3 unspecified: Secondary | ICD-10-CM | POA: Diagnosis not present

## 2022-04-19 DIAGNOSIS — M1711 Unilateral primary osteoarthritis, right knee: Secondary | ICD-10-CM | POA: Diagnosis not present

## 2022-04-21 DIAGNOSIS — Z9181 History of falling: Secondary | ICD-10-CM | POA: Diagnosis not present

## 2022-04-21 DIAGNOSIS — E1169 Type 2 diabetes mellitus with other specified complication: Secondary | ICD-10-CM | POA: Diagnosis not present

## 2022-04-21 DIAGNOSIS — I130 Hypertensive heart and chronic kidney disease with heart failure and stage 1 through stage 4 chronic kidney disease, or unspecified chronic kidney disease: Secondary | ICD-10-CM | POA: Diagnosis not present

## 2022-04-21 DIAGNOSIS — N400 Enlarged prostate without lower urinary tract symptoms: Secondary | ICD-10-CM | POA: Diagnosis not present

## 2022-04-21 DIAGNOSIS — H9190 Unspecified hearing loss, unspecified ear: Secondary | ICD-10-CM | POA: Diagnosis not present

## 2022-04-21 DIAGNOSIS — I509 Heart failure, unspecified: Secondary | ICD-10-CM | POA: Diagnosis not present

## 2022-04-21 DIAGNOSIS — M1711 Unilateral primary osteoarthritis, right knee: Secondary | ICD-10-CM | POA: Diagnosis not present

## 2022-04-21 DIAGNOSIS — J309 Allergic rhinitis, unspecified: Secondary | ICD-10-CM | POA: Diagnosis not present

## 2022-04-21 DIAGNOSIS — N183 Chronic kidney disease, stage 3 unspecified: Secondary | ICD-10-CM | POA: Diagnosis not present

## 2022-04-21 DIAGNOSIS — Z7984 Long term (current) use of oral hypoglycemic drugs: Secondary | ICD-10-CM | POA: Diagnosis not present

## 2022-04-21 DIAGNOSIS — E782 Mixed hyperlipidemia: Secondary | ICD-10-CM | POA: Diagnosis not present

## 2022-04-21 DIAGNOSIS — M11869 Other specified crystal arthropathies, unspecified knee: Secondary | ICD-10-CM | POA: Diagnosis not present

## 2022-04-21 DIAGNOSIS — D500 Iron deficiency anemia secondary to blood loss (chronic): Secondary | ICD-10-CM | POA: Diagnosis not present

## 2022-04-22 DIAGNOSIS — I130 Hypertensive heart and chronic kidney disease with heart failure and stage 1 through stage 4 chronic kidney disease, or unspecified chronic kidney disease: Secondary | ICD-10-CM | POA: Diagnosis not present

## 2022-04-22 DIAGNOSIS — N183 Chronic kidney disease, stage 3 unspecified: Secondary | ICD-10-CM | POA: Diagnosis not present

## 2022-04-22 DIAGNOSIS — J309 Allergic rhinitis, unspecified: Secondary | ICD-10-CM | POA: Diagnosis not present

## 2022-04-22 DIAGNOSIS — I509 Heart failure, unspecified: Secondary | ICD-10-CM | POA: Diagnosis not present

## 2022-04-22 DIAGNOSIS — M1711 Unilateral primary osteoarthritis, right knee: Secondary | ICD-10-CM | POA: Diagnosis not present

## 2022-04-22 DIAGNOSIS — N400 Enlarged prostate without lower urinary tract symptoms: Secondary | ICD-10-CM | POA: Diagnosis not present

## 2022-04-23 DIAGNOSIS — J309 Allergic rhinitis, unspecified: Secondary | ICD-10-CM | POA: Diagnosis not present

## 2022-04-23 DIAGNOSIS — N400 Enlarged prostate without lower urinary tract symptoms: Secondary | ICD-10-CM | POA: Diagnosis not present

## 2022-04-23 DIAGNOSIS — M1711 Unilateral primary osteoarthritis, right knee: Secondary | ICD-10-CM | POA: Diagnosis not present

## 2022-04-23 DIAGNOSIS — N183 Chronic kidney disease, stage 3 unspecified: Secondary | ICD-10-CM | POA: Diagnosis not present

## 2022-04-23 DIAGNOSIS — I130 Hypertensive heart and chronic kidney disease with heart failure and stage 1 through stage 4 chronic kidney disease, or unspecified chronic kidney disease: Secondary | ICD-10-CM | POA: Diagnosis not present

## 2022-04-23 DIAGNOSIS — I509 Heart failure, unspecified: Secondary | ICD-10-CM | POA: Diagnosis not present

## 2022-04-24 DIAGNOSIS — N400 Enlarged prostate without lower urinary tract symptoms: Secondary | ICD-10-CM | POA: Diagnosis not present

## 2022-04-24 DIAGNOSIS — I130 Hypertensive heart and chronic kidney disease with heart failure and stage 1 through stage 4 chronic kidney disease, or unspecified chronic kidney disease: Secondary | ICD-10-CM | POA: Diagnosis not present

## 2022-04-24 DIAGNOSIS — M1711 Unilateral primary osteoarthritis, right knee: Secondary | ICD-10-CM | POA: Diagnosis not present

## 2022-04-24 DIAGNOSIS — I509 Heart failure, unspecified: Secondary | ICD-10-CM | POA: Diagnosis not present

## 2022-04-24 DIAGNOSIS — J309 Allergic rhinitis, unspecified: Secondary | ICD-10-CM | POA: Diagnosis not present

## 2022-04-24 DIAGNOSIS — N183 Chronic kidney disease, stage 3 unspecified: Secondary | ICD-10-CM | POA: Diagnosis not present

## 2022-04-28 DIAGNOSIS — I509 Heart failure, unspecified: Secondary | ICD-10-CM | POA: Diagnosis not present

## 2022-04-28 DIAGNOSIS — M1711 Unilateral primary osteoarthritis, right knee: Secondary | ICD-10-CM | POA: Diagnosis not present

## 2022-04-28 DIAGNOSIS — N183 Chronic kidney disease, stage 3 unspecified: Secondary | ICD-10-CM | POA: Diagnosis not present

## 2022-04-28 DIAGNOSIS — J309 Allergic rhinitis, unspecified: Secondary | ICD-10-CM | POA: Diagnosis not present

## 2022-04-28 DIAGNOSIS — I130 Hypertensive heart and chronic kidney disease with heart failure and stage 1 through stage 4 chronic kidney disease, or unspecified chronic kidney disease: Secondary | ICD-10-CM | POA: Diagnosis not present

## 2022-04-28 DIAGNOSIS — N400 Enlarged prostate without lower urinary tract symptoms: Secondary | ICD-10-CM | POA: Diagnosis not present

## 2022-04-30 DIAGNOSIS — J309 Allergic rhinitis, unspecified: Secondary | ICD-10-CM | POA: Diagnosis not present

## 2022-04-30 DIAGNOSIS — I130 Hypertensive heart and chronic kidney disease with heart failure and stage 1 through stage 4 chronic kidney disease, or unspecified chronic kidney disease: Secondary | ICD-10-CM | POA: Diagnosis not present

## 2022-04-30 DIAGNOSIS — I509 Heart failure, unspecified: Secondary | ICD-10-CM | POA: Diagnosis not present

## 2022-04-30 DIAGNOSIS — M1711 Unilateral primary osteoarthritis, right knee: Secondary | ICD-10-CM | POA: Diagnosis not present

## 2022-04-30 DIAGNOSIS — N400 Enlarged prostate without lower urinary tract symptoms: Secondary | ICD-10-CM | POA: Diagnosis not present

## 2022-04-30 DIAGNOSIS — N183 Chronic kidney disease, stage 3 unspecified: Secondary | ICD-10-CM | POA: Diagnosis not present

## 2022-05-01 DIAGNOSIS — J309 Allergic rhinitis, unspecified: Secondary | ICD-10-CM | POA: Diagnosis not present

## 2022-05-01 DIAGNOSIS — I130 Hypertensive heart and chronic kidney disease with heart failure and stage 1 through stage 4 chronic kidney disease, or unspecified chronic kidney disease: Secondary | ICD-10-CM | POA: Diagnosis not present

## 2022-05-01 DIAGNOSIS — N183 Chronic kidney disease, stage 3 unspecified: Secondary | ICD-10-CM | POA: Diagnosis not present

## 2022-05-01 DIAGNOSIS — M1711 Unilateral primary osteoarthritis, right knee: Secondary | ICD-10-CM | POA: Diagnosis not present

## 2022-05-01 DIAGNOSIS — N400 Enlarged prostate without lower urinary tract symptoms: Secondary | ICD-10-CM | POA: Diagnosis not present

## 2022-05-01 DIAGNOSIS — I509 Heart failure, unspecified: Secondary | ICD-10-CM | POA: Diagnosis not present

## 2022-05-02 DIAGNOSIS — I130 Hypertensive heart and chronic kidney disease with heart failure and stage 1 through stage 4 chronic kidney disease, or unspecified chronic kidney disease: Secondary | ICD-10-CM | POA: Diagnosis not present

## 2022-05-02 DIAGNOSIS — N400 Enlarged prostate without lower urinary tract symptoms: Secondary | ICD-10-CM | POA: Diagnosis not present

## 2022-05-02 DIAGNOSIS — J309 Allergic rhinitis, unspecified: Secondary | ICD-10-CM | POA: Diagnosis not present

## 2022-05-02 DIAGNOSIS — I509 Heart failure, unspecified: Secondary | ICD-10-CM | POA: Diagnosis not present

## 2022-05-02 DIAGNOSIS — M1711 Unilateral primary osteoarthritis, right knee: Secondary | ICD-10-CM | POA: Diagnosis not present

## 2022-05-02 DIAGNOSIS — N183 Chronic kidney disease, stage 3 unspecified: Secondary | ICD-10-CM | POA: Diagnosis not present

## 2022-05-06 DIAGNOSIS — I509 Heart failure, unspecified: Secondary | ICD-10-CM | POA: Diagnosis not present

## 2022-05-06 DIAGNOSIS — I130 Hypertensive heart and chronic kidney disease with heart failure and stage 1 through stage 4 chronic kidney disease, or unspecified chronic kidney disease: Secondary | ICD-10-CM | POA: Diagnosis not present

## 2022-05-06 DIAGNOSIS — N183 Chronic kidney disease, stage 3 unspecified: Secondary | ICD-10-CM | POA: Diagnosis not present

## 2022-05-06 DIAGNOSIS — N400 Enlarged prostate without lower urinary tract symptoms: Secondary | ICD-10-CM | POA: Diagnosis not present

## 2022-05-06 DIAGNOSIS — M1711 Unilateral primary osteoarthritis, right knee: Secondary | ICD-10-CM | POA: Diagnosis not present

## 2022-05-06 DIAGNOSIS — J309 Allergic rhinitis, unspecified: Secondary | ICD-10-CM | POA: Diagnosis not present

## 2022-05-07 DIAGNOSIS — I509 Heart failure, unspecified: Secondary | ICD-10-CM | POA: Diagnosis not present

## 2022-05-07 DIAGNOSIS — I130 Hypertensive heart and chronic kidney disease with heart failure and stage 1 through stage 4 chronic kidney disease, or unspecified chronic kidney disease: Secondary | ICD-10-CM | POA: Diagnosis not present

## 2022-05-07 DIAGNOSIS — J309 Allergic rhinitis, unspecified: Secondary | ICD-10-CM | POA: Diagnosis not present

## 2022-05-07 DIAGNOSIS — N400 Enlarged prostate without lower urinary tract symptoms: Secondary | ICD-10-CM | POA: Diagnosis not present

## 2022-05-07 DIAGNOSIS — N183 Chronic kidney disease, stage 3 unspecified: Secondary | ICD-10-CM | POA: Diagnosis not present

## 2022-05-07 DIAGNOSIS — M1711 Unilateral primary osteoarthritis, right knee: Secondary | ICD-10-CM | POA: Diagnosis not present

## 2022-05-12 DIAGNOSIS — I509 Heart failure, unspecified: Secondary | ICD-10-CM | POA: Diagnosis not present

## 2022-05-12 DIAGNOSIS — I130 Hypertensive heart and chronic kidney disease with heart failure and stage 1 through stage 4 chronic kidney disease, or unspecified chronic kidney disease: Secondary | ICD-10-CM | POA: Diagnosis not present

## 2022-05-12 DIAGNOSIS — N183 Chronic kidney disease, stage 3 unspecified: Secondary | ICD-10-CM | POA: Diagnosis not present

## 2022-05-12 DIAGNOSIS — J309 Allergic rhinitis, unspecified: Secondary | ICD-10-CM | POA: Diagnosis not present

## 2022-05-12 DIAGNOSIS — M1711 Unilateral primary osteoarthritis, right knee: Secondary | ICD-10-CM | POA: Diagnosis not present

## 2022-05-12 DIAGNOSIS — N400 Enlarged prostate without lower urinary tract symptoms: Secondary | ICD-10-CM | POA: Diagnosis not present

## 2022-05-14 DIAGNOSIS — J309 Allergic rhinitis, unspecified: Secondary | ICD-10-CM | POA: Diagnosis not present

## 2022-05-14 DIAGNOSIS — N183 Chronic kidney disease, stage 3 unspecified: Secondary | ICD-10-CM | POA: Diagnosis not present

## 2022-05-14 DIAGNOSIS — M1711 Unilateral primary osteoarthritis, right knee: Secondary | ICD-10-CM | POA: Diagnosis not present

## 2022-05-14 DIAGNOSIS — N400 Enlarged prostate without lower urinary tract symptoms: Secondary | ICD-10-CM | POA: Diagnosis not present

## 2022-05-14 DIAGNOSIS — I509 Heart failure, unspecified: Secondary | ICD-10-CM | POA: Diagnosis not present

## 2022-05-14 DIAGNOSIS — I130 Hypertensive heart and chronic kidney disease with heart failure and stage 1 through stage 4 chronic kidney disease, or unspecified chronic kidney disease: Secondary | ICD-10-CM | POA: Diagnosis not present

## 2022-05-15 DIAGNOSIS — N400 Enlarged prostate without lower urinary tract symptoms: Secondary | ICD-10-CM | POA: Diagnosis not present

## 2022-05-15 DIAGNOSIS — M1711 Unilateral primary osteoarthritis, right knee: Secondary | ICD-10-CM | POA: Diagnosis not present

## 2022-05-15 DIAGNOSIS — J309 Allergic rhinitis, unspecified: Secondary | ICD-10-CM | POA: Diagnosis not present

## 2022-05-15 DIAGNOSIS — N183 Chronic kidney disease, stage 3 unspecified: Secondary | ICD-10-CM | POA: Diagnosis not present

## 2022-05-15 DIAGNOSIS — I509 Heart failure, unspecified: Secondary | ICD-10-CM | POA: Diagnosis not present

## 2022-05-15 DIAGNOSIS — I130 Hypertensive heart and chronic kidney disease with heart failure and stage 1 through stage 4 chronic kidney disease, or unspecified chronic kidney disease: Secondary | ICD-10-CM | POA: Diagnosis not present

## 2022-05-16 DIAGNOSIS — N183 Chronic kidney disease, stage 3 unspecified: Secondary | ICD-10-CM | POA: Diagnosis not present

## 2022-05-16 DIAGNOSIS — I509 Heart failure, unspecified: Secondary | ICD-10-CM | POA: Diagnosis not present

## 2022-05-16 DIAGNOSIS — J309 Allergic rhinitis, unspecified: Secondary | ICD-10-CM | POA: Diagnosis not present

## 2022-05-16 DIAGNOSIS — I130 Hypertensive heart and chronic kidney disease with heart failure and stage 1 through stage 4 chronic kidney disease, or unspecified chronic kidney disease: Secondary | ICD-10-CM | POA: Diagnosis not present

## 2022-05-16 DIAGNOSIS — N400 Enlarged prostate without lower urinary tract symptoms: Secondary | ICD-10-CM | POA: Diagnosis not present

## 2022-05-16 DIAGNOSIS — M1711 Unilateral primary osteoarthritis, right knee: Secondary | ICD-10-CM | POA: Diagnosis not present

## 2022-05-19 DIAGNOSIS — N400 Enlarged prostate without lower urinary tract symptoms: Secondary | ICD-10-CM | POA: Diagnosis not present

## 2022-05-19 DIAGNOSIS — J309 Allergic rhinitis, unspecified: Secondary | ICD-10-CM | POA: Diagnosis not present

## 2022-05-19 DIAGNOSIS — I130 Hypertensive heart and chronic kidney disease with heart failure and stage 1 through stage 4 chronic kidney disease, or unspecified chronic kidney disease: Secondary | ICD-10-CM | POA: Diagnosis not present

## 2022-05-19 DIAGNOSIS — N183 Chronic kidney disease, stage 3 unspecified: Secondary | ICD-10-CM | POA: Diagnosis not present

## 2022-05-19 DIAGNOSIS — M1711 Unilateral primary osteoarthritis, right knee: Secondary | ICD-10-CM | POA: Diagnosis not present

## 2022-05-19 DIAGNOSIS — I509 Heart failure, unspecified: Secondary | ICD-10-CM | POA: Diagnosis not present

## 2022-06-12 DIAGNOSIS — U071 COVID-19: Secondary | ICD-10-CM | POA: Diagnosis not present

## 2022-06-19 ENCOUNTER — Emergency Department (HOSPITAL_COMMUNITY): Payer: Medicare Other

## 2022-06-19 ENCOUNTER — Other Ambulatory Visit: Payer: Self-pay

## 2022-06-19 ENCOUNTER — Emergency Department (HOSPITAL_COMMUNITY)
Admission: EM | Admit: 2022-06-19 | Discharge: 2022-06-19 | Disposition: A | Discharge status: Home / Self Care | Payer: Medicare Other | Attending: Emergency Medicine | Admitting: Emergency Medicine

## 2022-06-19 DIAGNOSIS — F0390 Unspecified dementia without behavioral disturbance: Secondary | ICD-10-CM | POA: Diagnosis not present

## 2022-06-19 DIAGNOSIS — R0602 Shortness of breath: Secondary | ICD-10-CM | POA: Diagnosis not present

## 2022-06-19 DIAGNOSIS — R000 Tachycardia, unspecified: Secondary | ICD-10-CM | POA: Diagnosis not present

## 2022-06-19 DIAGNOSIS — R0902 Hypoxemia: Secondary | ICD-10-CM | POA: Diagnosis not present

## 2022-06-19 DIAGNOSIS — R531 Weakness: Secondary | ICD-10-CM | POA: Diagnosis not present

## 2022-06-19 DIAGNOSIS — I4891 Unspecified atrial fibrillation: Secondary | ICD-10-CM | POA: Diagnosis not present

## 2022-06-19 DIAGNOSIS — I959 Hypotension, unspecified: Secondary | ICD-10-CM | POA: Diagnosis not present

## 2022-06-19 DIAGNOSIS — J9811 Atelectasis: Secondary | ICD-10-CM | POA: Diagnosis not present

## 2022-06-19 DIAGNOSIS — U071 COVID-19: Secondary | ICD-10-CM | POA: Diagnosis not present

## 2022-06-19 DIAGNOSIS — R739 Hyperglycemia, unspecified: Secondary | ICD-10-CM | POA: Diagnosis not present

## 2022-06-19 LAB — CBC WITH DIFFERENTIAL/PLATELET
Abs Immature Granulocytes: 0.08 10*3/uL — ABNORMAL HIGH (ref 0.00–0.07)
Basophils Absolute: 0 10*3/uL (ref 0.0–0.1)
Basophils Relative: 0 %
Eosinophils Absolute: 0 10*3/uL (ref 0.0–0.5)
Eosinophils Relative: 0 %
HCT: 35.3 % — ABNORMAL LOW (ref 39.0–52.0)
Hemoglobin: 11.5 g/dL — ABNORMAL LOW (ref 13.0–17.0)
Immature Granulocytes: 1 %
Lymphocytes Relative: 9 %
Lymphs Abs: 0.9 10*3/uL (ref 0.7–4.0)
MCH: 29 pg (ref 26.0–34.0)
MCHC: 32.6 g/dL (ref 30.0–36.0)
MCV: 89.1 fL (ref 80.0–100.0)
Monocytes Absolute: 0.7 10*3/uL (ref 0.1–1.0)
Monocytes Relative: 7 %
Neutro Abs: 8 10*3/uL — ABNORMAL HIGH (ref 1.7–7.7)
Neutrophils Relative %: 83 %
Platelets: 190 10*3/uL (ref 150–400)
RBC: 3.96 MIL/uL — ABNORMAL LOW (ref 4.22–5.81)
RDW: 15.1 % (ref 11.5–15.5)
WBC: 9.7 10*3/uL (ref 4.0–10.5)
nRBC: 0 % (ref 0.0–0.2)

## 2022-06-19 LAB — BLOOD GAS, VENOUS
Acid-Base Excess: 3.5 mmol/L — ABNORMAL HIGH (ref 0.0–2.0)
Bicarbonate: 27.8 mmol/L (ref 20.0–28.0)
Drawn by: 7012
O2 Saturation: 51.6 %
Patient temperature: 36.7
pCO2, Ven: 39 mmHg — ABNORMAL LOW (ref 44–60)
pH, Ven: 7.45 — ABNORMAL HIGH (ref 7.25–7.43)
pO2, Ven: <31 mmHg — CL (ref 32–45)

## 2022-06-19 LAB — BASIC METABOLIC PANEL
Anion gap: 13 (ref 5–15)
BUN: 30 mg/dL — ABNORMAL HIGH (ref 8–23)
CO2: 23 mmol/L (ref 22–32)
Calcium: 8.3 mg/dL — ABNORMAL LOW (ref 8.9–10.3)
Chloride: 98 mmol/L (ref 98–111)
Creatinine, Ser: 1.49 mg/dL — ABNORMAL HIGH (ref 0.61–1.24)
GFR, Estimated: 43 mL/min — ABNORMAL LOW (ref >60)
Glucose, Bld: 206 mg/dL — ABNORMAL HIGH (ref 70–99)
Potassium: 4 mmol/L (ref 3.5–5.1)
Sodium: 134 mmol/L — ABNORMAL LOW (ref 135–145)

## 2022-06-19 LAB — BRAIN NATRIURETIC PEPTIDE: B Natriuretic Peptide: 96 pg/mL (ref 0.0–100.0)

## 2022-06-19 LAB — RESP PANEL BY RT-PCR (FLU A&B, COVID) ARPGX2
Influenza A by PCR: NEGATIVE
Influenza B by PCR: NEGATIVE
SARS Coronavirus 2 by RT PCR: POSITIVE — AB

## 2022-06-19 LAB — TROPONIN I (HIGH SENSITIVITY): Troponin I (High Sensitivity): 23 ng/L — ABNORMAL HIGH (ref <18)

## 2022-06-19 MED ORDER — DILTIAZEM HCL 25 MG/5ML IV SOLN
10.0000 mg | Freq: Once | INTRAVENOUS | Status: AC
  Administered 2022-06-19: 10 mg via INTRAVENOUS
  Filled 2022-06-19: qty 5

## 2022-06-19 NOTE — Discharge Instructions (Signed)
Darin Moran was seen for COVID illness and hypotension today.  His blood pressure was low but stable in the emergency department today, around 102 systolic.  His heart rate was a little tachycardic which is also his baseline level, heart rate of 100-110 beats per minute.  It is important that he takes all of his medications at home as prescribed.  It is also important that he stays hydrated and drinks water to help with his blood pressure.  He should quarantine for an additional 2 days and slowly he has now had 10 days of COVID symptoms, and that he is no longer likely contagious.  I printed a copy of his blood test for your reference at home.  Please schedule follow-up appointment with his primary care provider.  Darin Moran was beginning to experience some claustrophobia and delirium in the hospital room here.  We felt that the safest place would be for him to be at home, in a familiar setting.

## 2022-06-19 NOTE — ED Notes (Signed)
Dr in the room talking to pt and family at this time with the door shut.

## 2022-06-19 NOTE — ED Notes (Signed)
Spoke with patient's grandson, Decklin Weddington, who clarified that patient tested positive for Covid on Wednesday 11/29

## 2022-06-19 NOTE — ED Notes (Signed)
Per the pt's son, Fender, patient tested positive for COVID last Saturday.

## 2022-06-19 NOTE — ED Notes (Addendum)
Pt is covid positive and family (grandson) states pt is claustrophobic and the door can not be closed. Nurse explained the pt does not have a mask on and he is putting other pts at risk for covid.

## 2022-06-19 NOTE — ED Provider Notes (Signed)
[facility] EMERGENCY DEPARTMENT Provider Note   CSN: [id number] Arrival date & time: 06/19/22  1121     History  Chief Complaint  Patient presents with   Hypotension    Darin Moran is a 88 y.o. male who is extremely hard of hearing presenting by EMS with concern for possible hypotension and shortness of breath.  The patient is an extremely poor historian, cannot seem to recall why he decided to come to the hospital, it is not clear if he has a history of dementia.  He reports that he has felt short of breath this morning.  EMS reports that his blood pressures were "softer" on scene at 90/50 but improved en route to the hospital.  The patient has a history of atrial tachycardia per his cardiology office evaluation and my review of external records; he also has a history of frequent PVCs and bilateral lower extremity edema.  This is echocardiogram in May 2022 showed an EF of 55 to 60% with grade 1 diastolic dysfunction  Update - his grandson later reports patient diagnosed with covid 8 days ago on Wednesday and has NOT had any issues with SOB, has been "asymptomatic," but their concern today was BP 030 systolic which was lower than usual.  Patient does not drink much water, only sweet tea.  His grandson is planning to stay with the patient to help care for him.  Typically the patient does NOT have significant dementia but is very hard of hearing and does get very anxious in hospitals.  HPI     Home Medications Prior to Admission medications   Medication Sig Start Date End Date Taking? Authorizing Provider  acetaminophen (TYLENOL) 650 MG CR tablet Take 650 mg by mouth every 8 (eight) hours as needed for pain. Take 1-2 every 8 hours as needed   Yes [provider]  cholecalciferol (VITAMIN D) 25 MCG (1000 UNIT) tablet Take 1,000 Units by mouth daily.   Yes [provider]  glipiZIDE (GLUCOTROL) 5 MG tablet Take 5 mg by mouth 2 (two) times daily.   Yes [provider]  lisinopril (ZESTRIL) 40 MG tablet Take 40 mg by mouth daily. 06/11/22  Yes [provider]  metFORMIN (GLUCOPHAGE) 500 MG tablet Take 500 mg by mouth 2 (two) times daily with a meal.   Yes [provider]  metoprolol succinate (TOPROL-XL) 25 MG 24 hr tablet Take 0.5 tablets (12.5 mg total) by mouth daily. Please schedule appointment for further refills 04/18/22  Yes Tobb, Kardie, DO  Multiple Vitamin (MULTIVITAMIN WITH MINERALS) TABS tablet Take 1 tablet by mouth daily.   Yes [provider]  tamsulosin (FLOMAX) 0.4 MG CAPS capsule Take 0.4 mg by mouth daily. 10/19/19  Yes [provider]  furosemide (LASIX) 40 MG tablet Take 1 tablet (40 mg total) by mouth daily. Take once on Tuesday and Friday. Patient taking differently: Take 40 mg by mouth 2 (two) times daily. 06/04/21 09/02/21  Tobb, Kardie, DO  Naphazoline-Pheniramine 0.027-0.315 % SOLN Place 1 drop into both eyes 3 (three) times daily as needed (dry/irritated/allergy eye relief). Patient not taking: Reported on 06/04/2021    [provider]  oxymetazoline (AFRIN) 0.05 % nasal spray Place 1 spray into both nostrils at bedtime as needed for congestion. Patient not taking: Reported on 06/04/2021    [provider]      Allergies    Nsaids    Review of Systems   Review of Systems  Physical Exam Updated Vital  Signs BP (!) 118/95   Pulse (!) 118   Temp 98 F (36.7 C) (Oral)   Resp (!) 28   Ht '5\' 11"'$  (1.803 m)   Wt 72.6 kg   SpO2 100%   BMI 22.32 kg/m  Physical Exam Constitutional:      General: He is not in acute distress. HENT:     Head: Normocephalic and atraumatic.  Eyes:     Conjunctiva/sclera: Conjunctivae normal.     Pupils: Pupils are equal, round, and reactive to light.  Cardiovascular:     Rate and Rhythm: Tachycardia present. Rhythm irregular.  Pulmonary:     Effort: Pulmonary effort is normal. No respiratory distress.     Comments: Tachypneic  with respiratory rate 25, rhonchi in the lower lobes bilaterally. Abdominal:     General: There is no distension.     Tenderness: There is no abdominal tenderness.  Skin:    General: Skin is warm and dry.  Neurological:     General: No focal deficit present.     Mental Status: He is alert. Mental status is at baseline.     ED Results / Procedures / Treatments   Labs (all labs ordered are listed, but only abnormal results are displayed) Labs Reviewed  RESP PANEL BY RT-PCR (FLU A&B, COVID) ARPGX2 - Abnormal; Notable for the following components:      Result Value   SARS Coronavirus 2 by RT PCR POSITIVE (*)    All other components within normal limits  BASIC METABOLIC PANEL - Abnormal; Notable for the following components:   Sodium 134 (*)    Glucose, Bld 206 (*)    BUN 30 (*)    Creatinine, Ser 1.49 (*)    Calcium 8.3 (*)    GFR, Estimated 43 (*)    All other components within normal limits  CBC WITH DIFFERENTIAL/PLATELET - Abnormal; Notable for the following components:   RBC 3.96 (*)    Hemoglobin 11.5 (*)    HCT 35.3 (*)    Neutro Abs 8.0 (*)    Abs Immature Granulocytes 0.08 (*)    All other components within normal limits  BLOOD GAS, VENOUS - Abnormal; Notable for the following components:   pH, Ven 7.45 (*)    pCO2, Ven 39 (*)    pO2, Ven <31 (*)    Acid-Base Excess 3.5 (*)    All other components within normal limits  TROPONIN I (HIGH SENSITIVITY) - Abnormal; Notable for the following components:   Troponin I (High Sensitivity) 23 (*)    All other components within normal limits  BRAIN NATRIURETIC PEPTIDE  TROPONIN I (HIGH SENSITIVITY)    EKG EKG Interpretation  Date/Time:  Thursday June 19 2022 11:34:05 EST Ventricular Rate:  119 PR Interval:    QRS Duration: 79 QT Interval:  328 QTC Calculation: 462 R Axis:   169 Text Interpretation: Right and left arm electrode reversal, interpretation assumes no reversal Atrial fibrillation Right axis deviation  Confirmed by Octaviano Glow [phone]) on 06/19/2022 11:36:40 AM  Radiology DG Chest Port 1 View  Result Date: 06/19/2022 CLINICAL DATA:  Hypotension and weakness. Recent positive for COVID. EXAM: PORTABLE CHEST 1 VIEW COMPARISON:  Chest radiograph dated 01/01/2022 FINDINGS: Unchanged elevation of the right hemidiaphragm. Apparent lucency under the right hemidiaphragm likely corresponds to the interposed colon. Low lung volumes. Bibasilar linear opacities, likely atelectasis. No pleural effusion or pneumothorax. The heart size and mediastinal contours are within normal limits. The visualized skeletal structures are unremarkable. IMPRESSION:  1. Low lung volumes with bibasilar atelectasis. 2. Apparent lucency under the right hemidiaphragm likely corresponds to the interposed colon. Electronically Signed   By: Darrin Nipper M.D.   On: 06/19/2022 12:21    Procedures Procedures    Medications Ordered in ED Medications  diltiazem (CARDIZEM) injection 10 mg (10 mg Intravenous Given 06/19/22 1217)    ED Course/ Medical Decision Making/ A&P Clinical Course as of 06/19/22 1653  Thu Jun 19, 2022  1448 SARS Coronavirus 2 by RT PCR(!): POSITIVE [MT]  44 Grandson says he tested positive last Wednesday. [MT]  1526 Patient's grandson is here will be taking him home and helping as his caretaker at home.  Given that the patient has now had COVID for 8 days of symptoms, I do not see an indication for hospitalization.  I think would be best to keep him at home to prevent hospital delirium and the family member agrees.  Okay for discharge [MT]    Clinical Course User Index [MT] Kitzia Camus, Carola Rhine, MD                           Medical Decision Making Amount and/or Complexity of Data Reviewed Labs: ordered. Decision-making details documented in ED Course. Radiology: ordered.  Risk Prescription drug management. Decision regarding hospitalization.   This patient presents to the ED with concern for shortness of  breath. This involves an extensive number of treatment options, and is a complaint that carries with it a high risk of complications and morbidity.  The differential diagnosis includes CHF exacerbation versus pneumonia versus pleural effusion versus anemia versus other  Co-morbidities that complicate the patient evaluation: History of diastolic heart failure at high risk of congestive heart failure exacerbations.  Additional history obtained from EMS, patient's grandson  External records from outside source obtained and reviewed including cardiology echo and outpatient records  I ordered and personally interpreted labs.  The pertinent results include:  no significant changes from baseline levels, chronic CKD, trop 23 unremarkable in this setting, unlikely ACS.  BNP wnl.  VBG okay.  Covid still positive as expected.  No acute anemia or leukocytosis  I ordered imaging studies including xray chest I independently visualized and interpreted imaging which showed no emergent findings I agree with the radiologist interpretation  The patient was maintained on a cardiac monitor.  I personally viewed and interpreted the cardiac monitored which showed an underlying rhythm of: A-fib versus tachycardia, HR 110-130's bpm  Per my interpretation the patient's ECG shows atrial tachycardia versus A-fib  I ordered medication including diltizem for HR control (unclear if patient took morning metoprolol) I have reviewed the patients home medicines and have made adjustments as needed  Test Considered: Doubt acute CVA/stroke or PE  During his stay in the ED the patient became increasingly agitated, reporting claustrophobia, angry that door would not remain open, saying "I've got to get out of here."  I strongly suspect this is delirium related to hospital setting and anxiety.  He did not become hypoxia and was otherwise breathing comfortably.  I spoke to his grandson about this and we agreed the most optimal outcome  would be to take the patient home to his familiar environment; grandson will be staying with him at home, ensuring appropriate medications given.  BP has been relatively normal here - doubt septic shock, heart failure, or other emergent issue at play.  Dispostion:  After consideration of the diagnostic results and the patients response  to treatment, I feel that the patent would benefit from outpatient PCP f/u.         Final Clinical Impression(s) / ED Diagnoses Final diagnoses:  [id number]    Rx / DC Orders ED Discharge Orders     None         Cannie Muckle, Carola Rhine, MD 06/19/22 [phone]

## 2022-06-19 NOTE — ED Triage Notes (Signed)
Pt c/o hypotension and weakness from home. Pt is hard of hearing.

## 2022-06-19 NOTE — ED Notes (Signed)
CRITICAL VALUE STICKER  CRITICAL VALUE: pO2 < 31  MD NOTIFIED: Dr. Langston Masker  TIME OF NOTIFICATION: 3606  RESPONSE: No new orders at this time

## 2022-07-01 DIAGNOSIS — M179 Osteoarthritis of knee, unspecified: Secondary | ICD-10-CM | POA: Diagnosis not present

## 2022-07-01 DIAGNOSIS — I10 Essential (primary) hypertension: Secondary | ICD-10-CM | POA: Diagnosis not present

## 2022-07-01 DIAGNOSIS — R609 Edema, unspecified: Secondary | ICD-10-CM | POA: Diagnosis not present

## 2022-07-01 DIAGNOSIS — L89301 Pressure ulcer of unspecified buttock, stage 1: Secondary | ICD-10-CM | POA: Diagnosis not present

## 2022-07-10 DIAGNOSIS — E119 Type 2 diabetes mellitus without complications: Secondary | ICD-10-CM | POA: Diagnosis not present

## 2022-07-10 DIAGNOSIS — I878 Other specified disorders of veins: Secondary | ICD-10-CM | POA: Diagnosis not present

## 2022-07-10 DIAGNOSIS — L03115 Cellulitis of right lower limb: Secondary | ICD-10-CM | POA: Diagnosis present

## 2022-07-10 DIAGNOSIS — R404 Transient alteration of awareness: Secondary | ICD-10-CM | POA: Diagnosis not present

## 2022-07-10 DIAGNOSIS — E11622 Type 2 diabetes mellitus with other skin ulcer: Secondary | ICD-10-CM | POA: Diagnosis present

## 2022-07-10 DIAGNOSIS — U071 COVID-19: Secondary | ICD-10-CM | POA: Diagnosis not present

## 2022-07-10 DIAGNOSIS — J9811 Atelectasis: Secondary | ICD-10-CM | POA: Diagnosis not present

## 2022-07-10 DIAGNOSIS — R059 Cough, unspecified: Secondary | ICD-10-CM | POA: Diagnosis not present

## 2022-07-10 DIAGNOSIS — E11628 Type 2 diabetes mellitus with other skin complications: Secondary | ICD-10-CM | POA: Diagnosis present

## 2022-07-10 DIAGNOSIS — L97919 Non-pressure chronic ulcer of unspecified part of right lower leg with unspecified severity: Secondary | ICD-10-CM | POA: Diagnosis present

## 2022-07-10 DIAGNOSIS — Z7984 Long term (current) use of oral hypoglycemic drugs: Secondary | ICD-10-CM | POA: Diagnosis not present

## 2022-07-10 DIAGNOSIS — M5136 Other intervertebral disc degeneration, lumbar region: Secondary | ICD-10-CM | POA: Diagnosis present

## 2022-07-10 DIAGNOSIS — L89302 Pressure ulcer of unspecified buttock, stage 2: Secondary | ICD-10-CM | POA: Diagnosis present

## 2022-07-10 DIAGNOSIS — R0689 Other abnormalities of breathing: Secondary | ICD-10-CM | POA: Diagnosis not present

## 2022-07-10 DIAGNOSIS — R54 Age-related physical debility: Secondary | ICD-10-CM | POA: Diagnosis not present

## 2022-07-10 DIAGNOSIS — R410 Disorientation, unspecified: Secondary | ICD-10-CM | POA: Diagnosis not present

## 2022-07-10 DIAGNOSIS — R0902 Hypoxemia: Secondary | ICD-10-CM | POA: Diagnosis not present

## 2022-07-10 DIAGNOSIS — Z8616 Personal history of COVID-19: Secondary | ICD-10-CM | POA: Diagnosis not present

## 2022-07-10 DIAGNOSIS — I509 Heart failure, unspecified: Secondary | ICD-10-CM | POA: Diagnosis present

## 2022-07-10 DIAGNOSIS — R4182 Altered mental status, unspecified: Secondary | ICD-10-CM | POA: Diagnosis present

## 2022-07-10 DIAGNOSIS — L89159 Pressure ulcer of sacral region, unspecified stage: Secondary | ICD-10-CM | POA: Diagnosis not present

## 2022-07-10 DIAGNOSIS — D72829 Elevated white blood cell count, unspecified: Secondary | ICD-10-CM | POA: Diagnosis not present

## 2022-07-10 DIAGNOSIS — I959 Hypotension, unspecified: Secondary | ICD-10-CM | POA: Diagnosis not present

## 2022-07-10 DIAGNOSIS — R9431 Abnormal electrocardiogram [ECG] [EKG]: Secondary | ICD-10-CM | POA: Diagnosis not present

## 2022-07-10 DIAGNOSIS — I4891 Unspecified atrial fibrillation: Secondary | ICD-10-CM | POA: Diagnosis not present

## 2022-07-10 DIAGNOSIS — A419 Sepsis, unspecified organism: Secondary | ICD-10-CM | POA: Diagnosis not present

## 2022-07-10 DIAGNOSIS — R000 Tachycardia, unspecified: Secondary | ICD-10-CM | POA: Diagnosis not present

## 2022-07-10 DIAGNOSIS — L8990 Pressure ulcer of unspecified site, unspecified stage: Secondary | ICD-10-CM | POA: Diagnosis not present

## 2022-07-10 DIAGNOSIS — Z79899 Other long term (current) drug therapy: Secondary | ICD-10-CM | POA: Diagnosis not present

## 2022-07-10 DIAGNOSIS — Z66 Do not resuscitate: Secondary | ICD-10-CM | POA: Diagnosis not present

## 2022-07-10 DIAGNOSIS — L89322 Pressure ulcer of left buttock, stage 2: Secondary | ICD-10-CM | POA: Diagnosis not present

## 2022-07-10 DIAGNOSIS — I10 Essential (primary) hypertension: Secondary | ICD-10-CM | POA: Diagnosis not present

## 2022-07-12 DIAGNOSIS — R600 Localized edema: Secondary | ICD-10-CM | POA: Diagnosis not present

## 2022-07-12 DIAGNOSIS — R4182 Altered mental status, unspecified: Secondary | ICD-10-CM | POA: Diagnosis not present

## 2022-07-12 DIAGNOSIS — I509 Heart failure, unspecified: Secondary | ICD-10-CM | POA: Diagnosis not present

## 2022-07-12 DIAGNOSIS — M170 Bilateral primary osteoarthritis of knee: Secondary | ICD-10-CM | POA: Diagnosis not present

## 2022-07-12 DIAGNOSIS — I10 Essential (primary) hypertension: Secondary | ICD-10-CM | POA: Diagnosis not present

## 2022-07-12 DIAGNOSIS — E119 Type 2 diabetes mellitus without complications: Secondary | ICD-10-CM | POA: Diagnosis not present

## 2022-07-12 DIAGNOSIS — M1990 Unspecified osteoarthritis, unspecified site: Secondary | ICD-10-CM | POA: Diagnosis not present

## 2022-07-12 DIAGNOSIS — E43 Unspecified severe protein-calorie malnutrition: Secondary | ICD-10-CM | POA: Diagnosis not present

## 2022-07-12 DIAGNOSIS — R54 Age-related physical debility: Secondary | ICD-10-CM | POA: Diagnosis not present

## 2022-07-14 DIAGNOSIS — M170 Bilateral primary osteoarthritis of knee: Secondary | ICD-10-CM | POA: Diagnosis not present

## 2022-07-14 DIAGNOSIS — E119 Type 2 diabetes mellitus without complications: Secondary | ICD-10-CM | POA: Diagnosis not present

## 2022-07-14 DIAGNOSIS — M1990 Unspecified osteoarthritis, unspecified site: Secondary | ICD-10-CM | POA: Diagnosis not present

## 2022-07-14 DIAGNOSIS — R600 Localized edema: Secondary | ICD-10-CM | POA: Diagnosis not present

## 2022-07-14 DIAGNOSIS — I10 Essential (primary) hypertension: Secondary | ICD-10-CM | POA: Diagnosis not present

## 2022-07-14 DIAGNOSIS — R54 Age-related physical debility: Secondary | ICD-10-CM | POA: Diagnosis not present

## 2022-07-14 DIAGNOSIS — R4182 Altered mental status, unspecified: Secondary | ICD-10-CM | POA: Diagnosis not present

## 2022-07-14 DIAGNOSIS — E43 Unspecified severe protein-calorie malnutrition: Secondary | ICD-10-CM | POA: Diagnosis not present

## 2022-07-14 DIAGNOSIS — I509 Heart failure, unspecified: Secondary | ICD-10-CM | POA: Diagnosis not present

## 2022-07-15 DIAGNOSIS — M1990 Unspecified osteoarthritis, unspecified site: Secondary | ICD-10-CM | POA: Diagnosis not present

## 2022-07-15 DIAGNOSIS — M170 Bilateral primary osteoarthritis of knee: Secondary | ICD-10-CM | POA: Diagnosis not present

## 2022-07-15 DIAGNOSIS — E43 Unspecified severe protein-calorie malnutrition: Secondary | ICD-10-CM | POA: Diagnosis not present

## 2022-07-15 DIAGNOSIS — R600 Localized edema: Secondary | ICD-10-CM | POA: Diagnosis not present

## 2022-07-15 DIAGNOSIS — I10 Essential (primary) hypertension: Secondary | ICD-10-CM | POA: Diagnosis not present

## 2022-07-15 DIAGNOSIS — E119 Type 2 diabetes mellitus without complications: Secondary | ICD-10-CM | POA: Diagnosis not present

## 2022-07-16 DIAGNOSIS — E119 Type 2 diabetes mellitus without complications: Secondary | ICD-10-CM | POA: Diagnosis not present

## 2022-07-16 DIAGNOSIS — E43 Unspecified severe protein-calorie malnutrition: Secondary | ICD-10-CM | POA: Diagnosis not present

## 2022-07-16 DIAGNOSIS — R600 Localized edema: Secondary | ICD-10-CM | POA: Diagnosis not present

## 2022-07-16 DIAGNOSIS — M170 Bilateral primary osteoarthritis of knee: Secondary | ICD-10-CM | POA: Diagnosis not present

## 2022-07-16 DIAGNOSIS — I10 Essential (primary) hypertension: Secondary | ICD-10-CM | POA: Diagnosis not present

## 2022-07-16 DIAGNOSIS — M1990 Unspecified osteoarthritis, unspecified site: Secondary | ICD-10-CM | POA: Diagnosis not present

## 2022-07-18 DIAGNOSIS — M170 Bilateral primary osteoarthritis of knee: Secondary | ICD-10-CM | POA: Diagnosis not present

## 2022-07-18 DIAGNOSIS — E43 Unspecified severe protein-calorie malnutrition: Secondary | ICD-10-CM | POA: Diagnosis not present

## 2022-07-18 DIAGNOSIS — M1990 Unspecified osteoarthritis, unspecified site: Secondary | ICD-10-CM | POA: Diagnosis not present

## 2022-07-18 DIAGNOSIS — I10 Essential (primary) hypertension: Secondary | ICD-10-CM | POA: Diagnosis not present

## 2022-07-18 DIAGNOSIS — R600 Localized edema: Secondary | ICD-10-CM | POA: Diagnosis not present

## 2022-07-18 DIAGNOSIS — E119 Type 2 diabetes mellitus without complications: Secondary | ICD-10-CM | POA: Diagnosis not present

## 2022-07-21 DIAGNOSIS — I10 Essential (primary) hypertension: Secondary | ICD-10-CM | POA: Diagnosis not present

## 2022-07-21 DIAGNOSIS — E43 Unspecified severe protein-calorie malnutrition: Secondary | ICD-10-CM | POA: Diagnosis not present

## 2022-07-21 DIAGNOSIS — M1990 Unspecified osteoarthritis, unspecified site: Secondary | ICD-10-CM | POA: Diagnosis not present

## 2022-07-21 DIAGNOSIS — M170 Bilateral primary osteoarthritis of knee: Secondary | ICD-10-CM | POA: Diagnosis not present

## 2022-07-21 DIAGNOSIS — R600 Localized edema: Secondary | ICD-10-CM | POA: Diagnosis not present

## 2022-07-21 DIAGNOSIS — E119 Type 2 diabetes mellitus without complications: Secondary | ICD-10-CM | POA: Diagnosis not present

## 2022-07-24 DIAGNOSIS — E119 Type 2 diabetes mellitus without complications: Secondary | ICD-10-CM | POA: Diagnosis not present

## 2022-07-24 DIAGNOSIS — I10 Essential (primary) hypertension: Secondary | ICD-10-CM | POA: Diagnosis not present

## 2022-07-24 DIAGNOSIS — E43 Unspecified severe protein-calorie malnutrition: Secondary | ICD-10-CM | POA: Diagnosis not present

## 2022-07-24 DIAGNOSIS — M1990 Unspecified osteoarthritis, unspecified site: Secondary | ICD-10-CM | POA: Diagnosis not present

## 2022-07-24 DIAGNOSIS — R600 Localized edema: Secondary | ICD-10-CM | POA: Diagnosis not present

## 2022-07-24 DIAGNOSIS — M170 Bilateral primary osteoarthritis of knee: Secondary | ICD-10-CM | POA: Diagnosis not present

## 2022-07-25 DIAGNOSIS — R600 Localized edema: Secondary | ICD-10-CM | POA: Diagnosis not present

## 2022-07-25 DIAGNOSIS — E43 Unspecified severe protein-calorie malnutrition: Secondary | ICD-10-CM | POA: Diagnosis not present

## 2022-07-25 DIAGNOSIS — M1990 Unspecified osteoarthritis, unspecified site: Secondary | ICD-10-CM | POA: Diagnosis not present

## 2022-07-25 DIAGNOSIS — E119 Type 2 diabetes mellitus without complications: Secondary | ICD-10-CM | POA: Diagnosis not present

## 2022-07-25 DIAGNOSIS — I10 Essential (primary) hypertension: Secondary | ICD-10-CM | POA: Diagnosis not present

## 2022-07-25 DIAGNOSIS — M170 Bilateral primary osteoarthritis of knee: Secondary | ICD-10-CM | POA: Diagnosis not present

## 2022-07-29 DIAGNOSIS — M170 Bilateral primary osteoarthritis of knee: Secondary | ICD-10-CM | POA: Diagnosis not present

## 2022-07-29 DIAGNOSIS — R600 Localized edema: Secondary | ICD-10-CM | POA: Diagnosis not present

## 2022-07-29 DIAGNOSIS — E119 Type 2 diabetes mellitus without complications: Secondary | ICD-10-CM | POA: Diagnosis not present

## 2022-07-29 DIAGNOSIS — M1990 Unspecified osteoarthritis, unspecified site: Secondary | ICD-10-CM | POA: Diagnosis not present

## 2022-07-29 DIAGNOSIS — E43 Unspecified severe protein-calorie malnutrition: Secondary | ICD-10-CM | POA: Diagnosis not present

## 2022-07-29 DIAGNOSIS — I10 Essential (primary) hypertension: Secondary | ICD-10-CM | POA: Diagnosis not present

## 2022-08-01 DIAGNOSIS — M1990 Unspecified osteoarthritis, unspecified site: Secondary | ICD-10-CM | POA: Diagnosis not present

## 2022-08-01 DIAGNOSIS — M170 Bilateral primary osteoarthritis of knee: Secondary | ICD-10-CM | POA: Diagnosis not present

## 2022-08-01 DIAGNOSIS — I10 Essential (primary) hypertension: Secondary | ICD-10-CM | POA: Diagnosis not present

## 2022-08-01 DIAGNOSIS — E119 Type 2 diabetes mellitus without complications: Secondary | ICD-10-CM | POA: Diagnosis not present

## 2022-08-01 DIAGNOSIS — R600 Localized edema: Secondary | ICD-10-CM | POA: Diagnosis not present

## 2022-08-01 DIAGNOSIS — E43 Unspecified severe protein-calorie malnutrition: Secondary | ICD-10-CM | POA: Diagnosis not present

## 2022-08-04 DIAGNOSIS — E43 Unspecified severe protein-calorie malnutrition: Secondary | ICD-10-CM | POA: Diagnosis not present

## 2022-08-04 DIAGNOSIS — I10 Essential (primary) hypertension: Secondary | ICD-10-CM | POA: Diagnosis not present

## 2022-08-04 DIAGNOSIS — R600 Localized edema: Secondary | ICD-10-CM | POA: Diagnosis not present

## 2022-08-04 DIAGNOSIS — M170 Bilateral primary osteoarthritis of knee: Secondary | ICD-10-CM | POA: Diagnosis not present

## 2022-08-04 DIAGNOSIS — E119 Type 2 diabetes mellitus without complications: Secondary | ICD-10-CM | POA: Diagnosis not present

## 2022-08-04 DIAGNOSIS — M1990 Unspecified osteoarthritis, unspecified site: Secondary | ICD-10-CM | POA: Diagnosis not present

## 2022-08-06 DIAGNOSIS — R600 Localized edema: Secondary | ICD-10-CM | POA: Diagnosis not present

## 2022-08-06 DIAGNOSIS — E119 Type 2 diabetes mellitus without complications: Secondary | ICD-10-CM | POA: Diagnosis not present

## 2022-08-06 DIAGNOSIS — I10 Essential (primary) hypertension: Secondary | ICD-10-CM | POA: Diagnosis not present

## 2022-08-06 DIAGNOSIS — M170 Bilateral primary osteoarthritis of knee: Secondary | ICD-10-CM | POA: Diagnosis not present

## 2022-08-06 DIAGNOSIS — E43 Unspecified severe protein-calorie malnutrition: Secondary | ICD-10-CM | POA: Diagnosis not present

## 2022-08-06 DIAGNOSIS — M1990 Unspecified osteoarthritis, unspecified site: Secondary | ICD-10-CM | POA: Diagnosis not present

## 2022-08-08 DIAGNOSIS — M1990 Unspecified osteoarthritis, unspecified site: Secondary | ICD-10-CM | POA: Diagnosis not present

## 2022-08-08 DIAGNOSIS — M170 Bilateral primary osteoarthritis of knee: Secondary | ICD-10-CM | POA: Diagnosis not present

## 2022-08-08 DIAGNOSIS — E43 Unspecified severe protein-calorie malnutrition: Secondary | ICD-10-CM | POA: Diagnosis not present

## 2022-08-08 DIAGNOSIS — I10 Essential (primary) hypertension: Secondary | ICD-10-CM | POA: Diagnosis not present

## 2022-08-08 DIAGNOSIS — R600 Localized edema: Secondary | ICD-10-CM | POA: Diagnosis not present

## 2022-08-08 DIAGNOSIS — E119 Type 2 diabetes mellitus without complications: Secondary | ICD-10-CM | POA: Diagnosis not present

## 2022-08-11 DIAGNOSIS — I10 Essential (primary) hypertension: Secondary | ICD-10-CM | POA: Diagnosis not present

## 2022-08-11 DIAGNOSIS — M1990 Unspecified osteoarthritis, unspecified site: Secondary | ICD-10-CM | POA: Diagnosis not present

## 2022-08-11 DIAGNOSIS — E43 Unspecified severe protein-calorie malnutrition: Secondary | ICD-10-CM | POA: Diagnosis not present

## 2022-08-11 DIAGNOSIS — M170 Bilateral primary osteoarthritis of knee: Secondary | ICD-10-CM | POA: Diagnosis not present

## 2022-08-11 DIAGNOSIS — E119 Type 2 diabetes mellitus without complications: Secondary | ICD-10-CM | POA: Diagnosis not present

## 2022-08-11 DIAGNOSIS — R600 Localized edema: Secondary | ICD-10-CM | POA: Diagnosis not present

## 2022-08-13 DIAGNOSIS — M1990 Unspecified osteoarthritis, unspecified site: Secondary | ICD-10-CM | POA: Diagnosis not present

## 2022-08-13 DIAGNOSIS — E43 Unspecified severe protein-calorie malnutrition: Secondary | ICD-10-CM | POA: Diagnosis not present

## 2022-08-13 DIAGNOSIS — R600 Localized edema: Secondary | ICD-10-CM | POA: Diagnosis not present

## 2022-08-13 DIAGNOSIS — M170 Bilateral primary osteoarthritis of knee: Secondary | ICD-10-CM | POA: Diagnosis not present

## 2022-08-13 DIAGNOSIS — I10 Essential (primary) hypertension: Secondary | ICD-10-CM | POA: Diagnosis not present

## 2022-08-13 DIAGNOSIS — E119 Type 2 diabetes mellitus without complications: Secondary | ICD-10-CM | POA: Diagnosis not present

## 2022-08-14 DIAGNOSIS — R600 Localized edema: Secondary | ICD-10-CM | POA: Diagnosis not present

## 2022-08-14 DIAGNOSIS — E43 Unspecified severe protein-calorie malnutrition: Secondary | ICD-10-CM | POA: Diagnosis not present

## 2022-08-14 DIAGNOSIS — M1990 Unspecified osteoarthritis, unspecified site: Secondary | ICD-10-CM | POA: Diagnosis not present

## 2022-08-14 DIAGNOSIS — R4182 Altered mental status, unspecified: Secondary | ICD-10-CM | POA: Diagnosis not present

## 2022-08-14 DIAGNOSIS — R54 Age-related physical debility: Secondary | ICD-10-CM | POA: Diagnosis not present

## 2022-08-14 DIAGNOSIS — M170 Bilateral primary osteoarthritis of knee: Secondary | ICD-10-CM | POA: Diagnosis not present

## 2022-08-14 DIAGNOSIS — I509 Heart failure, unspecified: Secondary | ICD-10-CM | POA: Diagnosis not present

## 2022-08-14 DIAGNOSIS — I10 Essential (primary) hypertension: Secondary | ICD-10-CM | POA: Diagnosis not present

## 2022-08-14 DIAGNOSIS — E119 Type 2 diabetes mellitus without complications: Secondary | ICD-10-CM | POA: Diagnosis not present

## 2022-08-15 DIAGNOSIS — E119 Type 2 diabetes mellitus without complications: Secondary | ICD-10-CM | POA: Diagnosis not present

## 2022-08-15 DIAGNOSIS — R600 Localized edema: Secondary | ICD-10-CM | POA: Diagnosis not present

## 2022-08-15 DIAGNOSIS — I10 Essential (primary) hypertension: Secondary | ICD-10-CM | POA: Diagnosis not present

## 2022-08-15 DIAGNOSIS — M170 Bilateral primary osteoarthritis of knee: Secondary | ICD-10-CM | POA: Diagnosis not present

## 2022-08-15 DIAGNOSIS — E43 Unspecified severe protein-calorie malnutrition: Secondary | ICD-10-CM | POA: Diagnosis not present

## 2022-08-15 DIAGNOSIS — M1990 Unspecified osteoarthritis, unspecified site: Secondary | ICD-10-CM | POA: Diagnosis not present

## 2022-08-18 DIAGNOSIS — I10 Essential (primary) hypertension: Secondary | ICD-10-CM | POA: Diagnosis not present

## 2022-08-18 DIAGNOSIS — E119 Type 2 diabetes mellitus without complications: Secondary | ICD-10-CM | POA: Diagnosis not present

## 2022-08-18 DIAGNOSIS — M1990 Unspecified osteoarthritis, unspecified site: Secondary | ICD-10-CM | POA: Diagnosis not present

## 2022-08-18 DIAGNOSIS — E43 Unspecified severe protein-calorie malnutrition: Secondary | ICD-10-CM | POA: Diagnosis not present

## 2022-08-18 DIAGNOSIS — M170 Bilateral primary osteoarthritis of knee: Secondary | ICD-10-CM | POA: Diagnosis not present

## 2022-08-18 DIAGNOSIS — R600 Localized edema: Secondary | ICD-10-CM | POA: Diagnosis not present

## 2022-08-20 DIAGNOSIS — M1990 Unspecified osteoarthritis, unspecified site: Secondary | ICD-10-CM | POA: Diagnosis not present

## 2022-08-20 DIAGNOSIS — E43 Unspecified severe protein-calorie malnutrition: Secondary | ICD-10-CM | POA: Diagnosis not present

## 2022-08-20 DIAGNOSIS — I10 Essential (primary) hypertension: Secondary | ICD-10-CM | POA: Diagnosis not present

## 2022-08-20 DIAGNOSIS — E119 Type 2 diabetes mellitus without complications: Secondary | ICD-10-CM | POA: Diagnosis not present

## 2022-08-20 DIAGNOSIS — R600 Localized edema: Secondary | ICD-10-CM | POA: Diagnosis not present

## 2022-08-20 DIAGNOSIS — M170 Bilateral primary osteoarthritis of knee: Secondary | ICD-10-CM | POA: Diagnosis not present

## 2022-08-22 DIAGNOSIS — M1990 Unspecified osteoarthritis, unspecified site: Secondary | ICD-10-CM | POA: Diagnosis not present

## 2022-08-22 DIAGNOSIS — E119 Type 2 diabetes mellitus without complications: Secondary | ICD-10-CM | POA: Diagnosis not present

## 2022-08-22 DIAGNOSIS — E43 Unspecified severe protein-calorie malnutrition: Secondary | ICD-10-CM | POA: Diagnosis not present

## 2022-08-22 DIAGNOSIS — M170 Bilateral primary osteoarthritis of knee: Secondary | ICD-10-CM | POA: Diagnosis not present

## 2022-08-22 DIAGNOSIS — R600 Localized edema: Secondary | ICD-10-CM | POA: Diagnosis not present

## 2022-08-22 DIAGNOSIS — I10 Essential (primary) hypertension: Secondary | ICD-10-CM | POA: Diagnosis not present

## 2022-08-25 DIAGNOSIS — M1990 Unspecified osteoarthritis, unspecified site: Secondary | ICD-10-CM | POA: Diagnosis not present

## 2022-08-25 DIAGNOSIS — E119 Type 2 diabetes mellitus without complications: Secondary | ICD-10-CM | POA: Diagnosis not present

## 2022-08-25 DIAGNOSIS — R600 Localized edema: Secondary | ICD-10-CM | POA: Diagnosis not present

## 2022-08-25 DIAGNOSIS — M170 Bilateral primary osteoarthritis of knee: Secondary | ICD-10-CM | POA: Diagnosis not present

## 2022-08-25 DIAGNOSIS — E43 Unspecified severe protein-calorie malnutrition: Secondary | ICD-10-CM | POA: Diagnosis not present

## 2022-08-25 DIAGNOSIS — I10 Essential (primary) hypertension: Secondary | ICD-10-CM | POA: Diagnosis not present

## 2022-08-27 DIAGNOSIS — M170 Bilateral primary osteoarthritis of knee: Secondary | ICD-10-CM | POA: Diagnosis not present

## 2022-08-27 DIAGNOSIS — M1990 Unspecified osteoarthritis, unspecified site: Secondary | ICD-10-CM | POA: Diagnosis not present

## 2022-08-27 DIAGNOSIS — R600 Localized edema: Secondary | ICD-10-CM | POA: Diagnosis not present

## 2022-08-27 DIAGNOSIS — E43 Unspecified severe protein-calorie malnutrition: Secondary | ICD-10-CM | POA: Diagnosis not present

## 2022-08-27 DIAGNOSIS — E119 Type 2 diabetes mellitus without complications: Secondary | ICD-10-CM | POA: Diagnosis not present

## 2022-08-27 DIAGNOSIS — I10 Essential (primary) hypertension: Secondary | ICD-10-CM | POA: Diagnosis not present

## 2022-09-01 DIAGNOSIS — E43 Unspecified severe protein-calorie malnutrition: Secondary | ICD-10-CM | POA: Diagnosis not present

## 2022-09-01 DIAGNOSIS — M170 Bilateral primary osteoarthritis of knee: Secondary | ICD-10-CM | POA: Diagnosis not present

## 2022-09-01 DIAGNOSIS — R600 Localized edema: Secondary | ICD-10-CM | POA: Diagnosis not present

## 2022-09-01 DIAGNOSIS — I10 Essential (primary) hypertension: Secondary | ICD-10-CM | POA: Diagnosis not present

## 2022-09-01 DIAGNOSIS — E119 Type 2 diabetes mellitus without complications: Secondary | ICD-10-CM | POA: Diagnosis not present

## 2022-09-01 DIAGNOSIS — M1990 Unspecified osteoarthritis, unspecified site: Secondary | ICD-10-CM | POA: Diagnosis not present

## 2022-09-03 DIAGNOSIS — E119 Type 2 diabetes mellitus without complications: Secondary | ICD-10-CM | POA: Diagnosis not present

## 2022-09-03 DIAGNOSIS — I10 Essential (primary) hypertension: Secondary | ICD-10-CM | POA: Diagnosis not present

## 2022-09-03 DIAGNOSIS — R600 Localized edema: Secondary | ICD-10-CM | POA: Diagnosis not present

## 2022-09-03 DIAGNOSIS — M170 Bilateral primary osteoarthritis of knee: Secondary | ICD-10-CM | POA: Diagnosis not present

## 2022-09-03 DIAGNOSIS — M1990 Unspecified osteoarthritis, unspecified site: Secondary | ICD-10-CM | POA: Diagnosis not present

## 2022-09-03 DIAGNOSIS — E43 Unspecified severe protein-calorie malnutrition: Secondary | ICD-10-CM | POA: Diagnosis not present

## 2022-09-05 DIAGNOSIS — M170 Bilateral primary osteoarthritis of knee: Secondary | ICD-10-CM | POA: Diagnosis not present

## 2022-09-05 DIAGNOSIS — I10 Essential (primary) hypertension: Secondary | ICD-10-CM | POA: Diagnosis not present

## 2022-09-05 DIAGNOSIS — M1990 Unspecified osteoarthritis, unspecified site: Secondary | ICD-10-CM | POA: Diagnosis not present

## 2022-09-05 DIAGNOSIS — E43 Unspecified severe protein-calorie malnutrition: Secondary | ICD-10-CM | POA: Diagnosis not present

## 2022-09-05 DIAGNOSIS — R600 Localized edema: Secondary | ICD-10-CM | POA: Diagnosis not present

## 2022-09-05 DIAGNOSIS — E119 Type 2 diabetes mellitus without complications: Secondary | ICD-10-CM | POA: Diagnosis not present

## 2022-09-08 DIAGNOSIS — I10 Essential (primary) hypertension: Secondary | ICD-10-CM | POA: Diagnosis not present

## 2022-09-08 DIAGNOSIS — E119 Type 2 diabetes mellitus without complications: Secondary | ICD-10-CM | POA: Diagnosis not present

## 2022-09-08 DIAGNOSIS — M170 Bilateral primary osteoarthritis of knee: Secondary | ICD-10-CM | POA: Diagnosis not present

## 2022-09-08 DIAGNOSIS — M1990 Unspecified osteoarthritis, unspecified site: Secondary | ICD-10-CM | POA: Diagnosis not present

## 2022-09-08 DIAGNOSIS — R600 Localized edema: Secondary | ICD-10-CM | POA: Diagnosis not present

## 2022-09-08 DIAGNOSIS — E43 Unspecified severe protein-calorie malnutrition: Secondary | ICD-10-CM | POA: Diagnosis not present

## 2022-09-10 DIAGNOSIS — M170 Bilateral primary osteoarthritis of knee: Secondary | ICD-10-CM | POA: Diagnosis not present

## 2022-09-10 DIAGNOSIS — E43 Unspecified severe protein-calorie malnutrition: Secondary | ICD-10-CM | POA: Diagnosis not present

## 2022-09-10 DIAGNOSIS — M1990 Unspecified osteoarthritis, unspecified site: Secondary | ICD-10-CM | POA: Diagnosis not present

## 2022-09-10 DIAGNOSIS — E119 Type 2 diabetes mellitus without complications: Secondary | ICD-10-CM | POA: Diagnosis not present

## 2022-09-10 DIAGNOSIS — I10 Essential (primary) hypertension: Secondary | ICD-10-CM | POA: Diagnosis not present

## 2022-09-10 DIAGNOSIS — R600 Localized edema: Secondary | ICD-10-CM | POA: Diagnosis not present

## 2022-09-12 DIAGNOSIS — E119 Type 2 diabetes mellitus without complications: Secondary | ICD-10-CM | POA: Diagnosis not present

## 2022-09-12 DIAGNOSIS — I509 Heart failure, unspecified: Secondary | ICD-10-CM | POA: Diagnosis not present

## 2022-09-12 DIAGNOSIS — R54 Age-related physical debility: Secondary | ICD-10-CM | POA: Diagnosis not present

## 2022-09-12 DIAGNOSIS — R4182 Altered mental status, unspecified: Secondary | ICD-10-CM | POA: Diagnosis not present

## 2022-09-12 DIAGNOSIS — M1990 Unspecified osteoarthritis, unspecified site: Secondary | ICD-10-CM | POA: Diagnosis not present

## 2022-09-12 DIAGNOSIS — M170 Bilateral primary osteoarthritis of knee: Secondary | ICD-10-CM | POA: Diagnosis not present

## 2022-09-12 DIAGNOSIS — I10 Essential (primary) hypertension: Secondary | ICD-10-CM | POA: Diagnosis not present

## 2022-09-12 DIAGNOSIS — R600 Localized edema: Secondary | ICD-10-CM | POA: Diagnosis not present

## 2022-09-12 DIAGNOSIS — E43 Unspecified severe protein-calorie malnutrition: Secondary | ICD-10-CM | POA: Diagnosis not present

## 2022-09-15 DIAGNOSIS — E119 Type 2 diabetes mellitus without complications: Secondary | ICD-10-CM | POA: Diagnosis not present

## 2022-09-15 DIAGNOSIS — M170 Bilateral primary osteoarthritis of knee: Secondary | ICD-10-CM | POA: Diagnosis not present

## 2022-09-15 DIAGNOSIS — E43 Unspecified severe protein-calorie malnutrition: Secondary | ICD-10-CM | POA: Diagnosis not present

## 2022-09-15 DIAGNOSIS — M1990 Unspecified osteoarthritis, unspecified site: Secondary | ICD-10-CM | POA: Diagnosis not present

## 2022-09-15 DIAGNOSIS — I10 Essential (primary) hypertension: Secondary | ICD-10-CM | POA: Diagnosis not present

## 2022-09-15 DIAGNOSIS — R600 Localized edema: Secondary | ICD-10-CM | POA: Diagnosis not present

## 2022-09-17 DIAGNOSIS — E43 Unspecified severe protein-calorie malnutrition: Secondary | ICD-10-CM | POA: Diagnosis not present

## 2022-09-17 DIAGNOSIS — I10 Essential (primary) hypertension: Secondary | ICD-10-CM | POA: Diagnosis not present

## 2022-09-17 DIAGNOSIS — M1990 Unspecified osteoarthritis, unspecified site: Secondary | ICD-10-CM | POA: Diagnosis not present

## 2022-09-17 DIAGNOSIS — E119 Type 2 diabetes mellitus without complications: Secondary | ICD-10-CM | POA: Diagnosis not present

## 2022-09-17 DIAGNOSIS — M170 Bilateral primary osteoarthritis of knee: Secondary | ICD-10-CM | POA: Diagnosis not present

## 2022-09-17 DIAGNOSIS — R600 Localized edema: Secondary | ICD-10-CM | POA: Diagnosis not present

## 2022-09-19 DIAGNOSIS — M1990 Unspecified osteoarthritis, unspecified site: Secondary | ICD-10-CM | POA: Diagnosis not present

## 2022-09-19 DIAGNOSIS — R600 Localized edema: Secondary | ICD-10-CM | POA: Diagnosis not present

## 2022-09-19 DIAGNOSIS — E119 Type 2 diabetes mellitus without complications: Secondary | ICD-10-CM | POA: Diagnosis not present

## 2022-09-19 DIAGNOSIS — I10 Essential (primary) hypertension: Secondary | ICD-10-CM | POA: Diagnosis not present

## 2022-09-19 DIAGNOSIS — M170 Bilateral primary osteoarthritis of knee: Secondary | ICD-10-CM | POA: Diagnosis not present

## 2022-09-19 DIAGNOSIS — E43 Unspecified severe protein-calorie malnutrition: Secondary | ICD-10-CM | POA: Diagnosis not present

## 2022-09-22 DIAGNOSIS — R600 Localized edema: Secondary | ICD-10-CM | POA: Diagnosis not present

## 2022-09-22 DIAGNOSIS — E43 Unspecified severe protein-calorie malnutrition: Secondary | ICD-10-CM | POA: Diagnosis not present

## 2022-09-22 DIAGNOSIS — M170 Bilateral primary osteoarthritis of knee: Secondary | ICD-10-CM | POA: Diagnosis not present

## 2022-09-22 DIAGNOSIS — I10 Essential (primary) hypertension: Secondary | ICD-10-CM | POA: Diagnosis not present

## 2022-09-22 DIAGNOSIS — E119 Type 2 diabetes mellitus without complications: Secondary | ICD-10-CM | POA: Diagnosis not present

## 2022-09-22 DIAGNOSIS — M1990 Unspecified osteoarthritis, unspecified site: Secondary | ICD-10-CM | POA: Diagnosis not present

## 2022-09-24 DIAGNOSIS — E43 Unspecified severe protein-calorie malnutrition: Secondary | ICD-10-CM | POA: Diagnosis not present

## 2022-09-24 DIAGNOSIS — I10 Essential (primary) hypertension: Secondary | ICD-10-CM | POA: Diagnosis not present

## 2022-09-24 DIAGNOSIS — E119 Type 2 diabetes mellitus without complications: Secondary | ICD-10-CM | POA: Diagnosis not present

## 2022-09-24 DIAGNOSIS — R600 Localized edema: Secondary | ICD-10-CM | POA: Diagnosis not present

## 2022-09-24 DIAGNOSIS — M170 Bilateral primary osteoarthritis of knee: Secondary | ICD-10-CM | POA: Diagnosis not present

## 2022-09-24 DIAGNOSIS — M1990 Unspecified osteoarthritis, unspecified site: Secondary | ICD-10-CM | POA: Diagnosis not present

## 2022-09-26 DIAGNOSIS — M1990 Unspecified osteoarthritis, unspecified site: Secondary | ICD-10-CM | POA: Diagnosis not present

## 2022-09-26 DIAGNOSIS — R600 Localized edema: Secondary | ICD-10-CM | POA: Diagnosis not present

## 2022-09-26 DIAGNOSIS — E119 Type 2 diabetes mellitus without complications: Secondary | ICD-10-CM | POA: Diagnosis not present

## 2022-09-26 DIAGNOSIS — E43 Unspecified severe protein-calorie malnutrition: Secondary | ICD-10-CM | POA: Diagnosis not present

## 2022-09-26 DIAGNOSIS — I10 Essential (primary) hypertension: Secondary | ICD-10-CM | POA: Diagnosis not present

## 2022-09-26 DIAGNOSIS — M170 Bilateral primary osteoarthritis of knee: Secondary | ICD-10-CM | POA: Diagnosis not present

## 2022-09-29 DIAGNOSIS — E119 Type 2 diabetes mellitus without complications: Secondary | ICD-10-CM | POA: Diagnosis not present

## 2022-09-29 DIAGNOSIS — E43 Unspecified severe protein-calorie malnutrition: Secondary | ICD-10-CM | POA: Diagnosis not present

## 2022-09-29 DIAGNOSIS — M1990 Unspecified osteoarthritis, unspecified site: Secondary | ICD-10-CM | POA: Diagnosis not present

## 2022-09-29 DIAGNOSIS — I10 Essential (primary) hypertension: Secondary | ICD-10-CM | POA: Diagnosis not present

## 2022-09-29 DIAGNOSIS — M170 Bilateral primary osteoarthritis of knee: Secondary | ICD-10-CM | POA: Diagnosis not present

## 2022-09-29 DIAGNOSIS — R600 Localized edema: Secondary | ICD-10-CM | POA: Diagnosis not present

## 2022-10-01 DIAGNOSIS — R600 Localized edema: Secondary | ICD-10-CM | POA: Diagnosis not present

## 2022-10-01 DIAGNOSIS — I10 Essential (primary) hypertension: Secondary | ICD-10-CM | POA: Diagnosis not present

## 2022-10-01 DIAGNOSIS — E119 Type 2 diabetes mellitus without complications: Secondary | ICD-10-CM | POA: Diagnosis not present

## 2022-10-01 DIAGNOSIS — M1990 Unspecified osteoarthritis, unspecified site: Secondary | ICD-10-CM | POA: Diagnosis not present

## 2022-10-01 DIAGNOSIS — M170 Bilateral primary osteoarthritis of knee: Secondary | ICD-10-CM | POA: Diagnosis not present

## 2022-10-01 DIAGNOSIS — E43 Unspecified severe protein-calorie malnutrition: Secondary | ICD-10-CM | POA: Diagnosis not present

## 2022-10-02 ENCOUNTER — Encounter (HOSPITAL_COMMUNITY): Payer: Self-pay | Admitting: Family Medicine

## 2022-10-02 ENCOUNTER — Emergency Department (HOSPITAL_COMMUNITY)

## 2022-10-02 ENCOUNTER — Other Ambulatory Visit: Payer: Self-pay

## 2022-10-02 ENCOUNTER — Inpatient Hospital Stay (HOSPITAL_COMMUNITY)
Admission: EM | Admit: 2022-10-02 | Discharge: 2022-10-13 | DRG: 951 | Disposition: E | Attending: Internal Medicine | Admitting: Internal Medicine

## 2022-10-02 DIAGNOSIS — R680 Hypothermia, not associated with low environmental temperature: Secondary | ICD-10-CM | POA: Diagnosis present

## 2022-10-02 DIAGNOSIS — I459 Conduction disorder, unspecified: Secondary | ICD-10-CM | POA: Diagnosis present

## 2022-10-02 DIAGNOSIS — E872 Acidosis, unspecified: Secondary | ICD-10-CM | POA: Diagnosis present

## 2022-10-02 DIAGNOSIS — R609 Edema, unspecified: Secondary | ICD-10-CM | POA: Diagnosis not present

## 2022-10-02 DIAGNOSIS — D649 Anemia, unspecified: Secondary | ICD-10-CM

## 2022-10-02 DIAGNOSIS — E43 Unspecified severe protein-calorie malnutrition: Secondary | ICD-10-CM | POA: Diagnosis not present

## 2022-10-02 DIAGNOSIS — F0390 Unspecified dementia without behavioral disturbance: Secondary | ICD-10-CM | POA: Diagnosis present

## 2022-10-02 DIAGNOSIS — G9340 Encephalopathy, unspecified: Secondary | ICD-10-CM

## 2022-10-02 DIAGNOSIS — E875 Hyperkalemia: Secondary | ICD-10-CM | POA: Diagnosis present

## 2022-10-02 DIAGNOSIS — K922 Gastrointestinal hemorrhage, unspecified: Secondary | ICD-10-CM | POA: Diagnosis present

## 2022-10-02 DIAGNOSIS — R4182 Altered mental status, unspecified: Secondary | ICD-10-CM | POA: Diagnosis not present

## 2022-10-02 DIAGNOSIS — T68XXXA Hypothermia, initial encounter: Secondary | ICD-10-CM | POA: Diagnosis present

## 2022-10-02 DIAGNOSIS — I959 Hypotension, unspecified: Secondary | ICD-10-CM | POA: Diagnosis not present

## 2022-10-02 DIAGNOSIS — M170 Bilateral primary osteoarthritis of knee: Secondary | ICD-10-CM | POA: Diagnosis not present

## 2022-10-02 DIAGNOSIS — N179 Acute kidney failure, unspecified: Principal | ICD-10-CM | POA: Diagnosis present

## 2022-10-02 DIAGNOSIS — Z79899 Other long term (current) drug therapy: Secondary | ICD-10-CM

## 2022-10-02 DIAGNOSIS — F03911 Unspecified dementia, unspecified severity, with agitation: Secondary | ICD-10-CM | POA: Diagnosis present

## 2022-10-02 DIAGNOSIS — Z66 Do not resuscitate: Secondary | ICD-10-CM | POA: Insufficient documentation

## 2022-10-02 DIAGNOSIS — R600 Localized edema: Secondary | ICD-10-CM | POA: Diagnosis not present

## 2022-10-02 DIAGNOSIS — Z800 Family history of malignant neoplasm of digestive organs: Secondary | ICD-10-CM

## 2022-10-02 DIAGNOSIS — Z515 Encounter for palliative care: Principal | ICD-10-CM

## 2022-10-02 DIAGNOSIS — E1122 Type 2 diabetes mellitus with diabetic chronic kidney disease: Secondary | ICD-10-CM | POA: Diagnosis not present

## 2022-10-02 DIAGNOSIS — D500 Iron deficiency anemia secondary to blood loss (chronic): Secondary | ICD-10-CM | POA: Diagnosis present

## 2022-10-02 DIAGNOSIS — J9811 Atelectasis: Secondary | ICD-10-CM | POA: Diagnosis not present

## 2022-10-02 DIAGNOSIS — Z7984 Long term (current) use of oral hypoglycemic drugs: Secondary | ICD-10-CM

## 2022-10-02 DIAGNOSIS — N1832 Chronic kidney disease, stage 3b: Secondary | ICD-10-CM | POA: Diagnosis not present

## 2022-10-02 DIAGNOSIS — Z886 Allergy status to analgesic agent status: Secondary | ICD-10-CM

## 2022-10-02 DIAGNOSIS — M1990 Unspecified osteoarthritis, unspecified site: Secondary | ICD-10-CM | POA: Diagnosis present

## 2022-10-02 DIAGNOSIS — Z993 Dependence on wheelchair: Secondary | ICD-10-CM

## 2022-10-02 DIAGNOSIS — E119 Type 2 diabetes mellitus without complications: Secondary | ICD-10-CM | POA: Diagnosis not present

## 2022-10-02 DIAGNOSIS — L89312 Pressure ulcer of right buttock, stage 2: Secondary | ICD-10-CM | POA: Diagnosis present

## 2022-10-02 DIAGNOSIS — R296 Repeated falls: Secondary | ICD-10-CM | POA: Diagnosis present

## 2022-10-02 DIAGNOSIS — L89322 Pressure ulcer of left buttock, stage 2: Secondary | ICD-10-CM | POA: Diagnosis present

## 2022-10-02 DIAGNOSIS — Z87891 Personal history of nicotine dependence: Secondary | ICD-10-CM

## 2022-10-02 DIAGNOSIS — I10 Essential (primary) hypertension: Secondary | ICD-10-CM | POA: Diagnosis not present

## 2022-10-02 DIAGNOSIS — R000 Tachycardia, unspecified: Secondary | ICD-10-CM | POA: Diagnosis not present

## 2022-10-02 DIAGNOSIS — I129 Hypertensive chronic kidney disease with stage 1 through stage 4 chronic kidney disease, or unspecified chronic kidney disease: Secondary | ICD-10-CM | POA: Diagnosis present

## 2022-10-02 DIAGNOSIS — L8990 Pressure ulcer of unspecified site, unspecified stage: Secondary | ICD-10-CM | POA: Insufficient documentation

## 2022-10-02 DIAGNOSIS — G253 Myoclonus: Secondary | ICD-10-CM | POA: Diagnosis not present

## 2022-10-02 DIAGNOSIS — G9341 Metabolic encephalopathy: Secondary | ICD-10-CM | POA: Diagnosis present

## 2022-10-02 LAB — CBC WITH DIFFERENTIAL/PLATELET
Abs Immature Granulocytes: 0.06 10*3/uL (ref 0.00–0.07)
Basophils Absolute: 0 10*3/uL (ref 0.0–0.1)
Basophils Relative: 1 %
Eosinophils Absolute: 0 10*3/uL (ref 0.0–0.5)
Eosinophils Relative: 1 %
HCT: 21.1 % — ABNORMAL LOW (ref 39.0–52.0)
Hemoglobin: 6.7 g/dL — CL (ref 13.0–17.0)
Immature Granulocytes: 1 %
Lymphocytes Relative: 15 %
Lymphs Abs: 1 10*3/uL (ref 0.7–4.0)
MCH: 29.6 pg (ref 26.0–34.0)
MCHC: 31.8 g/dL (ref 30.0–36.0)
MCV: 93.4 fL (ref 80.0–100.0)
Monocytes Absolute: 0.6 10*3/uL (ref 0.1–1.0)
Monocytes Relative: 9 %
Neutro Abs: 4.9 10*3/uL (ref 1.7–7.7)
Neutrophils Relative %: 73 %
Platelets: 222 10*3/uL (ref 150–400)
RBC: 2.26 MIL/uL — ABNORMAL LOW (ref 4.22–5.81)
RDW: 18.7 % — ABNORMAL HIGH (ref 11.5–15.5)
WBC: 6.6 10*3/uL (ref 4.0–10.5)
nRBC: 0 % (ref 0.0–0.2)

## 2022-10-02 LAB — COMPREHENSIVE METABOLIC PANEL
ALT: 16 U/L (ref 0–44)
AST: 33 U/L (ref 15–41)
Albumin: 2.7 g/dL — ABNORMAL LOW (ref 3.5–5.0)
Alkaline Phosphatase: 50 U/L (ref 38–126)
Anion gap: 13 (ref 5–15)
BUN: 113 mg/dL — ABNORMAL HIGH (ref 8–23)
CO2: 14 mmol/L — ABNORMAL LOW (ref 22–32)
Calcium: 8.4 mg/dL — ABNORMAL LOW (ref 8.9–10.3)
Chloride: 105 mmol/L (ref 98–111)
Creatinine, Ser: 6.1 mg/dL — ABNORMAL HIGH (ref 0.61–1.24)
GFR, Estimated: 8 mL/min — ABNORMAL LOW (ref >60)
Glucose, Bld: 115 mg/dL — ABNORMAL HIGH (ref 70–99)
Potassium: 6.8 mmol/L (ref 3.5–5.1)
Sodium: 132 mmol/L — ABNORMAL LOW (ref 135–145)
Total Bilirubin: 0.4 mg/dL (ref 0.3–1.2)
Total Protein: 5.4 g/dL — ABNORMAL LOW (ref 6.5–8.1)

## 2022-10-02 LAB — POC OCCULT BLOOD, ED: Fecal Occult Bld: POSITIVE — AB

## 2022-10-02 MED ORDER — PANTOPRAZOLE SODIUM 40 MG IV SOLR
40.0000 mg | Freq: Two times a day (BID) | INTRAVENOUS | Status: DC
  Administered 2022-10-03 (×2): 40 mg via INTRAVENOUS
  Filled 2022-10-02 (×2): qty 10

## 2022-10-02 MED ORDER — INSULIN ASPART 100 UNIT/ML IV SOLN
5.0000 [IU] | Freq: Once | INTRAVENOUS | Status: AC
  Administered 2022-10-03: 5 [IU] via INTRAVENOUS

## 2022-10-02 MED ORDER — SODIUM CHLORIDE 0.9 % IV SOLN
1.0000 g | Freq: Once | INTRAVENOUS | Status: AC
  Administered 2022-10-03: 1 g via INTRAVENOUS
  Filled 2022-10-02: qty 10

## 2022-10-02 MED ORDER — STERILE WATER FOR INJECTION IV SOLN
INTRAVENOUS | Status: DC
  Filled 2022-10-02 (×2): qty 1000

## 2022-10-02 MED ORDER — SODIUM CHLORIDE 0.9 % IV BOLUS
500.0000 mL | Freq: Once | INTRAVENOUS | Status: AC
  Administered 2022-10-03: 500 mL via INTRAVENOUS

## 2022-10-02 MED ORDER — SODIUM ZIRCONIUM CYCLOSILICATE 10 G PO PACK: 10.0000 g | PACK | Freq: Once | ORAL | Status: DC

## 2022-10-02 MED ORDER — SODIUM BICARBONATE 8.4 % IV SOLN
50.0000 meq | Freq: Once | INTRAVENOUS | Status: AC
  Administered 2022-10-03: 50 meq via INTRAVENOUS
  Filled 2022-10-02: qty 50

## 2022-10-02 MED ORDER — CALCIUM GLUCONATE-NACL 1-0.675 GM/50ML-% IV SOLN
1.0000 g | Freq: Once | INTRAVENOUS | Status: AC
  Administered 2022-10-03: 1000 mg via INTRAVENOUS
  Filled 2022-10-02: qty 50

## 2022-10-02 MED ORDER — HALOPERIDOL LACTATE 5 MG/ML IJ SOLN
2.0000 mg | Freq: Once | INTRAMUSCULAR | Status: AC
  Administered 2022-10-02: 2 mg via INTRAVENOUS
  Filled 2022-10-02: qty 1

## 2022-10-02 MED ORDER — DEXTROSE 50 % IV SOLN
1.0000 | Freq: Once | INTRAVENOUS | Status: AC
  Administered 2022-10-03: 50 mL via INTRAVENOUS
  Filled 2022-10-02: qty 50

## 2022-10-02 NOTE — ED Triage Notes (Signed)
Pt presents to ED 25 via [facility] from home with above complaint.  Per family, pt is more altered than normal.  Pt responsive to voice and able to state his legs hurt.  Wounds/scabs noted to legs.  Per EMS, pt takes Ativan at home and was given his Ativan per family.  Pt's grandson is his caregiver.    Pt received 500 mL fluid with EMS.  Cobi Tafel (Grandson/Caregiver): [phone]

## 2022-10-02 NOTE — ED Notes (Addendum)
Upon return to unit from taking another pt upstairs, pt noted to have removed IV and gown covered with blood.  Staff assisting with changing pt.  Per provider, can hold off on starting another line as pt still has one working IV in place.  Pt noted to have urinated in bed prior to changing as gown and sheets wet.

## 2022-10-02 NOTE — ED Notes (Signed)
Pt pulling at lines and equipment again at this time.  Pt also keeps removing gown and bairhugger.  Mitts placed on pt at this time to prevent pt from harming self or removing lines/equipment.

## 2022-10-02 NOTE — ED Provider Notes (Signed)
[facility] Provider Note   CSN: [id number] Arrival date & time: 09/23/2022  2029     History  Chief Complaint  Patient presents with   Altered Mental Status    Darin Moran is a 89 y.o. male.  Patient sent to the emergency department by hospice RN who evaluated patient today.  Patient is reported to have had increasing weakness and altered mental status.  I spoke with patient's grandson who reports that patient has had increasing confusion and increasing combativeness for the past 3 days.  He reports patient has been on hospice but has had a significant decline over the past 3 days.  Patient's grandson reports that patient's son recently died.  He reports that patient has not been ambulatory independently for 3 years but he has been able to around with a wheelchair.  Patient's grandson reports patient has had several falls.  He reports patient has increased redness in his legs.  He reports patient has had similar episodes in the past when he had an infection.  He is concerned that patient has a urinary tract infection.  He reports patient would not want a blood transfusion he is DNR/DNI.  The history is provided by a relative.  Altered Mental Status Presenting symptoms: behavior changes, combativeness, confusion and disorientation   Severity:  Severe Most recent episode:  More than 2 days ago Timing:  Constant Progression:  Worsening Associated symptoms: abnormal movement and agitation        Home Medications Prior to Admission medications   Medication Sig Start Date End Date Taking? Authorizing Provider  acetaminophen (TYLENOL) 650 MG CR tablet Take 650 mg by mouth every 8 (eight) hours as needed for pain. Take 1-2 every 8 hours as needed    [provider]  cholecalciferol (VITAMIN D) 25 MCG (1000 UNIT) tablet Take 1,000 Units by mouth daily.    [provider]  furosemide (LASIX) 40 MG tablet Take 1 tablet (40 mg  total) by mouth daily. Take once on Tuesday and Friday. Patient taking differently: Take 40 mg by mouth 2 (two) times daily. 06/04/21 09/02/21  Tobb, Kardie, DO  glipiZIDE (GLUCOTROL) 5 MG tablet Take 5 mg by mouth 2 (two) times daily.    [provider]  lisinopril (ZESTRIL) 40 MG tablet Take 40 mg by mouth daily. 06/11/22   [provider]  metFORMIN (GLUCOPHAGE) 500 MG tablet Take 500 mg by mouth 2 (two) times daily with a meal.    [provider]  metoprolol succinate (TOPROL-XL) 25 MG 24 hr tablet Take 0.5 tablets (12.5 mg total) by mouth daily. Please schedule appointment for further refills 04/18/22   Tobb, Kardie, DO  Multiple Vitamin (MULTIVITAMIN WITH MINERALS) TABS tablet Take 1 tablet by mouth daily.    [provider]  Naphazoline-Pheniramine 0.027-0.315 % SOLN Place 1 drop into both eyes 3 (three) times daily as needed (dry/irritated/allergy eye relief). Patient not taking: Reported on 06/04/2021    [provider]  oxymetazoline (AFRIN) 0.05 % nasal spray Place 1 spray into both nostrils at bedtime as needed for congestion. Patient not taking: Reported on 06/04/2021    [provider]  tamsulosin (FLOMAX) 0.4 MG CAPS capsule Take 0.4 mg by mouth daily. 10/19/19   [provider]      Allergies    Nsaids    Review of Systems   Review of Systems  Psychiatric/Behavioral:  Positive for agitation and confusion.   All other systems  reviewed and are negative.   Physical Exam Updated Vital Signs BP 95/83 (BP Location: Right Arm)   Pulse 75   Temp (!) 95.6 F (35.3 C) (Rectal)   Resp 18   Ht 5\' 11"  (1.803 m)   Wt 72.6 kg   SpO2 98%   BMI 22.32 kg/m  Physical Exam HENT:     Head: Normocephalic and atraumatic.  Cardiovascular:     Rate and Rhythm: Normal rate.  Pulmonary:     Effort: Pulmonary effort is normal.  Abdominal:     General: Abdomen is flat.  Musculoskeletal:        General: Swelling and tenderness  present.     Comments: Male right mid leg to foot,  Neurological:     Mental Status: He is disoriented.     Motor: Weakness present.     ED Results / Procedures / Treatments   Labs (all labs ordered are listed, but only abnormal results are displayed) Labs Reviewed  CBC WITH DIFFERENTIAL/PLATELET - Abnormal; Notable for the following components:      Result Value   RBC 2.26 (*)    Hemoglobin 6.7 (*)    HCT 21.1 (*)    RDW 18.7 (*)    All other components within normal limits  POC OCCULT BLOOD, ED - Abnormal; Notable for the following components:   Fecal Occult Bld POSITIVE (*)    All other components within normal limits  CULTURE, BLOOD (ROUTINE X 2)  CULTURE, BLOOD (ROUTINE X 2)  COMPREHENSIVE METABOLIC PANEL  URINALYSIS, ROUTINE W REFLEX MICROSCOPIC    EKG None  Radiology DG Chest Port 1 View  Result Date: 10/04/2022 CLINICAL DATA:  Altered EXAM: PORTABLE CHEST 1 VIEW COMPARISON:  06/19/2022, 01/01/2022, 07/10/2022 FINDINGS: Low lung volumes with chronic elevation of right diaphragm. Stable cardiomediastinal silhouette. Atelectasis and or scarring at the bases. Slight increased airspace disease at left base. Aortic atherosclerosis IMPRESSION: Low lung volumes with chronic elevation of right diaphragm. Slight increased airspace disease at the left base, worsened atelectasis versus mild pneumonia Electronically Signed   By: Donavan Foil M.D.   On: 09/20/2022 21:46    Procedures Procedures    Medications Ordered in ED Medications - No data to display  ED Course/ Medical Decision Making/ A&P                             Medical Decision Making Patient sent to the emergency department by hospice due to inability to care for patient at home.  Patient's son recently died.  Patient has become combative and confused.  Amount and/or Complexity of Data Reviewed Independent Historian:     Details: Pt's Grandson reports pt falling and combative.  Hospice advised to send pt  to [facility] is hospice/pallative care.  DNR/DNI Labs: ordered. Decision-making details documented in ED Course.    Details: Hemoglobin is 6.7 Radiology: ordered and independent interpretation performed. Decision-making details documented in ED Course.    Details: X-ray ordered reviewed and interpreted patient may have pneumonia Discussion of management or test interpretation with external provider(s): I discussed patient with hospitalist who will admit  Risk Risk Details: Patient given IV fluids IV Rocephin and IV Haldol for combative behavior.  Patient's care turned over to Dr. Myna Hidalgo with hospitalist.           Final Clinical Impression(s) / ED Diagnoses Final diagnoses:  Altered mental status, unspecified altered mental status type  Anemia, unspecified  type    Rx / DC Orders ED Discharge Orders     None         Fransico Meadow, [place] 09/22/2022 [place], [place], [place] 10/04/2022 2334

## 2022-10-02 NOTE — ED Notes (Addendum)
Bair Hugger applied

## 2022-10-03 DIAGNOSIS — Z515 Encounter for palliative care: Secondary | ICD-10-CM | POA: Diagnosis not present

## 2022-10-03 DIAGNOSIS — I459 Conduction disorder, unspecified: Secondary | ICD-10-CM | POA: Diagnosis present

## 2022-10-03 DIAGNOSIS — E1122 Type 2 diabetes mellitus with diabetic chronic kidney disease: Secondary | ICD-10-CM | POA: Diagnosis present

## 2022-10-03 DIAGNOSIS — I129 Hypertensive chronic kidney disease with stage 1 through stage 4 chronic kidney disease, or unspecified chronic kidney disease: Secondary | ICD-10-CM | POA: Diagnosis present

## 2022-10-03 DIAGNOSIS — R680 Hypothermia, not associated with low environmental temperature: Secondary | ICD-10-CM | POA: Diagnosis present

## 2022-10-03 DIAGNOSIS — E119 Type 2 diabetes mellitus without complications: Secondary | ICD-10-CM | POA: Diagnosis not present

## 2022-10-03 DIAGNOSIS — E872 Acidosis, unspecified: Secondary | ICD-10-CM | POA: Diagnosis present

## 2022-10-03 DIAGNOSIS — I10 Essential (primary) hypertension: Secondary | ICD-10-CM | POA: Diagnosis not present

## 2022-10-03 DIAGNOSIS — L89322 Pressure ulcer of left buttock, stage 2: Secondary | ICD-10-CM | POA: Diagnosis present

## 2022-10-03 DIAGNOSIS — M170 Bilateral primary osteoarthritis of knee: Secondary | ICD-10-CM | POA: Diagnosis not present

## 2022-10-03 DIAGNOSIS — Z66 Do not resuscitate: Secondary | ICD-10-CM | POA: Diagnosis present

## 2022-10-03 DIAGNOSIS — K922 Gastrointestinal hemorrhage, unspecified: Secondary | ICD-10-CM | POA: Diagnosis present

## 2022-10-03 DIAGNOSIS — F03911 Unspecified dementia, unspecified severity, with agitation: Secondary | ICD-10-CM | POA: Diagnosis present

## 2022-10-03 DIAGNOSIS — G9341 Metabolic encephalopathy: Secondary | ICD-10-CM | POA: Diagnosis present

## 2022-10-03 DIAGNOSIS — N1832 Chronic kidney disease, stage 3b: Secondary | ICD-10-CM | POA: Diagnosis not present

## 2022-10-03 DIAGNOSIS — E875 Hyperkalemia: Secondary | ICD-10-CM | POA: Diagnosis present

## 2022-10-03 DIAGNOSIS — R296 Repeated falls: Secondary | ICD-10-CM | POA: Diagnosis present

## 2022-10-03 DIAGNOSIS — N179 Acute kidney failure, unspecified: Secondary | ICD-10-CM | POA: Diagnosis not present

## 2022-10-03 DIAGNOSIS — Z7984 Long term (current) use of oral hypoglycemic drugs: Secondary | ICD-10-CM | POA: Diagnosis not present

## 2022-10-03 DIAGNOSIS — R4182 Altered mental status, unspecified: Secondary | ICD-10-CM | POA: Diagnosis present

## 2022-10-03 DIAGNOSIS — Z7189 Other specified counseling: Secondary | ICD-10-CM

## 2022-10-03 DIAGNOSIS — L8990 Pressure ulcer of unspecified site, unspecified stage: Secondary | ICD-10-CM | POA: Insufficient documentation

## 2022-10-03 DIAGNOSIS — Z87891 Personal history of nicotine dependence: Secondary | ICD-10-CM | POA: Diagnosis not present

## 2022-10-03 DIAGNOSIS — I959 Hypotension, unspecified: Secondary | ICD-10-CM | POA: Insufficient documentation

## 2022-10-03 DIAGNOSIS — E43 Unspecified severe protein-calorie malnutrition: Secondary | ICD-10-CM | POA: Diagnosis not present

## 2022-10-03 DIAGNOSIS — L89312 Pressure ulcer of right buttock, stage 2: Secondary | ICD-10-CM | POA: Diagnosis present

## 2022-10-03 DIAGNOSIS — R600 Localized edema: Secondary | ICD-10-CM | POA: Diagnosis not present

## 2022-10-03 DIAGNOSIS — F0390 Unspecified dementia without behavioral disturbance: Secondary | ICD-10-CM | POA: Diagnosis present

## 2022-10-03 DIAGNOSIS — Z993 Dependence on wheelchair: Secondary | ICD-10-CM | POA: Diagnosis not present

## 2022-10-03 DIAGNOSIS — G253 Myoclonus: Secondary | ICD-10-CM | POA: Diagnosis not present

## 2022-10-03 DIAGNOSIS — D500 Iron deficiency anemia secondary to blood loss (chronic): Secondary | ICD-10-CM | POA: Diagnosis present

## 2022-10-03 DIAGNOSIS — M1990 Unspecified osteoarthritis, unspecified site: Secondary | ICD-10-CM | POA: Diagnosis not present

## 2022-10-03 LAB — BASIC METABOLIC PANEL
Anion gap: 14 (ref 5–15)
BUN: 107 mg/dL — ABNORMAL HIGH (ref 8–23)
CO2: 16 mmol/L — ABNORMAL LOW (ref 22–32)
Calcium: 8.2 mg/dL — ABNORMAL LOW (ref 8.9–10.3)
Chloride: 105 mmol/L (ref 98–111)
Creatinine, Ser: 5.84 mg/dL — ABNORMAL HIGH (ref 0.61–1.24)
GFR, Estimated: 8 mL/min — ABNORMAL LOW (ref >60)
Glucose, Bld: 101 mg/dL — ABNORMAL HIGH (ref 70–99)
Potassium: 5.9 mmol/L — ABNORMAL HIGH (ref 3.5–5.1)
Sodium: 135 mmol/L (ref 135–145)

## 2022-10-03 LAB — GLUCOSE, CAPILLARY
Glucose-Capillary: 107 mg/dL — ABNORMAL HIGH (ref 70–99)
Glucose-Capillary: 47 mg/dL — ABNORMAL LOW (ref 70–99)
Glucose-Capillary: 113 mg/dL — ABNORMAL HIGH (ref 70–99)

## 2022-10-03 LAB — CORTISOL: Cortisol, Plasma: 20.7 ug/dL

## 2022-10-03 MED ORDER — GLYCOPYRROLATE 0.2 MG/ML IJ SOLN: 0.2000 mg | INTRAMUSCULAR | Status: DC | PRN

## 2022-10-03 MED ORDER — TAMSULOSIN HCL 0.4 MG PO CAPS: 0.4000 mg | ORAL_CAPSULE | Freq: Every evening | ORAL | Status: DC

## 2022-10-03 MED ORDER — TRAZODONE HCL 50 MG PO TABS: 25.0000 mg | ORAL_TABLET | Freq: Every day | ORAL | Status: DC

## 2022-10-03 MED ORDER — LORAZEPAM 2 MG/ML IJ SOLN
0.5000 mg | INTRAMUSCULAR | Status: DC | PRN
  Administered 2022-10-04: 0.5 mg via INTRAVENOUS
  Filled 2022-10-03: qty 1

## 2022-10-03 MED ORDER — BIOTENE DRY MOUTH MT LIQD: 15.0000 mL | OROMUCOSAL | Status: DC | PRN

## 2022-10-03 MED ORDER — HYDROCORTISONE SOD SUC (PF) 100 MG IJ SOLR
100.0000 mg | Freq: Once | INTRAMUSCULAR | Status: AC
  Administered 2022-10-03: 100 mg via INTRAVENOUS
  Filled 2022-10-03: qty 2

## 2022-10-03 MED ORDER — SODIUM CHLORIDE 0.9% FLUSH
3.0000 mL | Freq: Two times a day (BID) | INTRAVENOUS | Status: DC
  Administered 2022-10-03 (×2): 3 mL via INTRAVENOUS

## 2022-10-03 MED ORDER — SODIUM CHLORIDE 0.9 % IV BOLUS
1000.0000 mL | Freq: Once | INTRAVENOUS | Status: AC
  Administered 2022-10-03: 1000 mL via INTRAVENOUS

## 2022-10-03 MED ORDER — LACTATED RINGERS IV BOLUS
500.0000 mL | Freq: Once | INTRAVENOUS | Status: AC
  Administered 2022-10-03: 500 mL via INTRAVENOUS

## 2022-10-03 MED ORDER — LACTATED RINGERS IV BOLUS
1000.0000 mL | Freq: Once | INTRAVENOUS | Status: AC
  Administered 2022-10-03: 1000 mL via INTRAVENOUS

## 2022-10-03 MED ORDER — ONDANSETRON 4 MG PO TBDP: 4.0000 mg | ORAL_TABLET | Freq: Four times a day (QID) | ORAL | Status: DC | PRN

## 2022-10-03 MED ORDER — FENTANYL CITRATE PF 50 MCG/ML IJ SOSY: 12.5000 ug | PREFILLED_SYRINGE | INTRAMUSCULAR | Status: DC | PRN

## 2022-10-03 MED ORDER — LORAZEPAM 2 MG/ML IJ SOLN
0.5000 mg | Freq: Three times a day (TID) | INTRAMUSCULAR | Status: DC
  Administered 2022-10-03 (×2): 0.5 mg via INTRAVENOUS
  Filled 2022-10-03 (×2): qty 1

## 2022-10-03 MED ORDER — GLYCOPYRROLATE 1 MG PO TABS: 1.0000 mg | ORAL_TABLET | ORAL | Status: DC | PRN

## 2022-10-03 MED ORDER — ACETAMINOPHEN 650 MG RE SUPP: 650.0000 mg | Freq: Four times a day (QID) | RECTAL | Status: DC | PRN

## 2022-10-03 MED ORDER — ACETAMINOPHEN 325 MG PO TABS: 650.0000 mg | ORAL_TABLET | Freq: Four times a day (QID) | ORAL | Status: DC | PRN

## 2022-10-03 MED ORDER — POLYVINYL ALCOHOL 1.4 % OP SOLN: 1.0000 [drp] | Freq: Four times a day (QID) | OPHTHALMIC | Status: DC | PRN

## 2022-10-03 MED ORDER — HALOPERIDOL LACTATE 5 MG/ML IJ SOLN
3.0000 mg | Freq: Four times a day (QID) | INTRAMUSCULAR | Status: DC | PRN
  Administered 2022-10-03 – 2022-10-04 (×2): 3 mg via INTRAVENOUS
  Filled 2022-10-03 (×3): qty 1

## 2022-10-03 MED ORDER — FENTANYL CITRATE PF 50 MCG/ML IJ SOSY
25.0000 ug | PREFILLED_SYRINGE | Freq: Three times a day (TID) | INTRAMUSCULAR | Status: DC
  Administered 2022-10-03 – 2022-10-04 (×2): 25 ug via INTRAVENOUS
  Filled 2022-10-03 (×2): qty 1

## 2022-10-03 MED ORDER — DEXTROSE 50 % IV SOLN
25.0000 g | INTRAVENOUS | Status: AC
  Administered 2022-10-03: 25 g via INTRAVENOUS
  Filled 2022-10-03: qty 50

## 2022-10-03 MED ORDER — ONDANSETRON HCL 4 MG/2ML IJ SOLN: 4.0000 mg | Freq: Four times a day (QID) | INTRAMUSCULAR | Status: DC | PRN

## 2022-10-03 MED ORDER — SODIUM CHLORIDE 0.9 % IV BOLUS: 500.0000 mL | Freq: Once | INTRAVENOUS | Status: DC

## 2022-10-03 MED ORDER — INSULIN ASPART 100 UNIT/ML IJ SOLN: 0.0000 [IU] | Freq: Three times a day (TID) | INTRAMUSCULAR | Status: DC

## 2022-10-03 NOTE — Progress Notes (Addendum)
[facility] night cross cover note:   I was notified by RN that patient is hypotensive.   Per my chart review, this 89 year old male who was admitted earlier this evening with acute renal failure, anemia, found to be fecal occult blood positive.  Family does not want the patient transfused.  He received IV fluid bolus in the ED, and was noted to be fluid responsive.  Consequently, I ordered an additional 500 cc normal saline bolus over 2 hours.  Admitting physician has consulted palliative care.  Update: will also order a 1 liter LR bolus, random AM cortisol level, and hydrocortisone 100 mg IV x 1 dose now.   Additional 500 cc bolus ordered as well.   Update: RN conveyed morning lab results that include potassium 5.9 (down from 6.8) and Creatinine 5.8 (down from 6.1). no new orders from my standpoint at this time).    Babs Bertin, DO Hospitalist

## 2022-10-03 NOTE — ED Notes (Signed)
Pt remains restless.  Pt attempted to remove/bite off mitts.  Pt not combative towards staff however.

## 2022-10-03 NOTE — H&P (Signed)
History and Physical    Darin Moran [id number] DOB: May 20, 1927 DOA: 09/29/2022  PCP: Lawerance Cruel, MD   Patient coming from: Home   Chief Complaint: Combative, increased confusion  HPI: Darin Moran is a 89 y.o. male with medical history significant for hypertension, type 2 diabetes mellitus, and arthritis who presents to the emergency department for increased confusion, falls, and combativeness.  Patient is cared for by his grandson and home hospice.  He has been increasingly confused and combative for the last 3 days, reportedly trying to strike his grandson and wife.  He continued to be combative despite receiving Ativan at home and his hospice nurse recommended transport to the ED.  Patient's grandson confirms that the patient is DNR/DNI and would not want invasive procedure, would not want blood transfusion, but would be okay with IV fluids and antibiotics if indicated.  ED Course: Upon arrival to the ED, patient is found to be hypothermic to 34.7 C rectally.  Labs are most notable for BUN 113, creatinine 6.10, bicarbonate 14, potassium 6.8, albumin 2.7, and hemoglobin 6.7.  Fecal occult blood testing is positive.  EKG demonstrates sinus rhythm with new intraventricular conduction delay.  Chest x-ray notable for low volumes with chronic elevation of the right hemidiaphragm and slight increase in left basilar opacity.  Bear hugger was placed, blood cultures collected, and the patient was given IV fluids, Rocephin, and Haldol in the ED.  Review of Systems:  ROS limited by patient's clinical condition.  Past Medical History:  Diagnosis Date   Arthritis    Diabetes mellitus without complication ([place])    Hypertension     Past Surgical History:  Procedure Laterality Date   CATARACT EXTRACTION W/PHACO Left 11/21/2019   Procedure: CATARACT EXTRACTION PHACO AND INTRAOCULAR LENS PLACEMENT (IOC) CDE:  15.38;  Surgeon: Baruch Goldmann, MD;  Location: [facility];  Service:  Ophthalmology;  Laterality: Left;   CATARACT EXTRACTION W/PHACO Right 12/05/2019   Procedure: CATARACT EXTRACTION PHACO AND INTRAOCULAR LENS PLACEMENT RIGHT EYE;  Surgeon: Baruch Goldmann, MD;  Location: [facility];  Service: Ophthalmology;  Laterality: Right;  CDE: 14.04    Social History:   reports that he has quit smoking. He has never used smokeless tobacco. He reports that he does not drink alcohol and does not use drugs.  Allergies  Allergen Reactions   Nsaids Other (See Comments)    Family History  Problem Relation Age of Onset   Cancer Father        colon     Prior to Admission medications   Medication Sig Start Date End Date Taking? Authorizing Provider  acetaminophen (TYLENOL) 650 MG CR tablet Take 650 mg by mouth every 8 (eight) hours as needed for pain. Take 1-2 every 8 hours as needed   Yes [provider]  furosemide (LASIX) 40 MG tablet Take 1 tablet (40 mg total) by mouth daily. Take once on Tuesday and Friday. Patient taking differently: Take 40 mg by mouth 2 (two) times daily. 06/04/21 09/30/2022 Yes Tobb, Kardie, DO  lisinopril (ZESTRIL) 40 MG tablet Take 40 mg by mouth daily. 06/11/22  Yes [provider]  metFORMIN (GLUCOPHAGE) 500 MG tablet Take 500 mg by mouth 2 (two) times daily with a meal.   Yes [provider]  metoprolol succinate (TOPROL-XL) 25 MG 24 hr tablet Take 0.5 tablets (12.5 mg total) by mouth daily. Please schedule appointment for further refills 04/18/22  Yes Tobb, Kardie, DO  pioglitazone (ACTOS) 45 MG tablet Take 45 mg  by mouth daily.   Yes [provider]  tamsulosin (FLOMAX) 0.4 MG CAPS capsule Take 0.4 mg by mouth every evening. 10/19/19  Yes [provider]  traZODone (DESYREL) 50 MG tablet Take 25 mg by mouth at bedtime. 09/25/22  Yes [provider]  Vitamin D, Ergocalciferol, (DRISDOL) 1.25 MG (50000 UNIT) CAPS capsule Take 50,000 Units by mouth once a week.    [provider]     Physical Exam: Vitals:   09/28/2022 2048 10/03/2022 2111 09/29/2022 2242 10/03/22 0053  BP:      Pulse:      Resp:      Temp: (!) 96 F (35.6 C) (!) 94.5 F (34.7 C) (!) 95.6 F (35.3 C) (!) 97.1 F (36.2 C)  TempSrc: Axillary Rectal Rectal Rectal  SpO2:      Weight: 72.6 kg     Height: 5\' 11"  (1.803 m)       Constitutional: NAD, no pallor or diaphoresis   Eyes: PERTLA, lids and conjunctivae normal ENMT: Mucous membranes are moist. Posterior pharynx clear of any exudate or lesions.   Neck: supple, no masses  Respiratory: no wheezing, no crackles. No accessory muscle use.  Cardiovascular: S1 & S2 heard, regular rate and rhythm. No JVD. Abdomen: No distension, no tenderness, soft. Bowel sounds active.  Musculoskeletal: no clubbing / cyanosis. No joint deformity upper and lower extremities.   Skin: Crusted lesions and bullae involving lower legs. Skin otherwise warm, dry, well-perfused. Neurologic: CN 2-12 grossly intact. Moving all extremities. Disoriented.  Psychiatric: Restless. Unable to be redirected.     Labs and Imaging on Admission: I have personally reviewed following labs and imaging studies  CBC: Recent Labs  Lab 10/01/2022 2140  WBC 6.6  NEUTROABS 4.9  HGB 6.7*  HCT 21.1*  MCV 93.4  PLT [id number]   Basic Metabolic Panel: Recent Labs  Lab 09/20/2022 2140  NA 132*  K 6.8*  CL 105  CO2 14*  GLUCOSE 115*  BUN 113*  CREATININE 6.10*  CALCIUM 8.4*   GFR: Estimated Creatinine Clearance: 7.6 mL/min (A) (by C-G formula based on SCr of 6.1 mg/dL (H)). Liver Function Tests: Recent Labs  Lab 09/19/2022 2140  AST 33  ALT 16  ALKPHOS 50  BILITOT 0.4  PROT 5.4*  ALBUMIN 2.7*   No results for input(s): "LIPASE", "AMYLASE" in the last 168 hours. No results for input(s): "AMMONIA" in the last 168 hours. Coagulation Profile: No results for input(s): "INR", "PROTIME" in the last 168 hours. Cardiac Enzymes: No results for input(s): "CKTOTAL", "CKMB", "CKMBINDEX",  "TROPONINI" in the last 168 hours. BNP (last 3 results) No results for input(s): "PROBNP" in the last 8760 hours. HbA1C: No results for input(s): "HGBA1C" in the last 72 hours. CBG: No results for input(s): "GLUCAP" in the last 168 hours. Lipid Profile: No results for input(s): "CHOL", "HDL", "LDLCALC", "TRIG", "CHOLHDL", "LDLDIRECT" in the last 72 hours. Thyroid Function Tests: No results for input(s): "TSH", "T4TOTAL", "FREET4", "T3FREE", "THYROIDAB" in the last 72 hours. Anemia Panel: No results for input(s): "VITAMINB12", "FOLATE", "FERRITIN", "TIBC", "IRON", "RETICCTPCT" in the last 72 hours. Urine analysis: No results found for: "COLORURINE", "APPEARANCEUR", "LABSPEC", "PHURINE", "GLUCOSEU", "HGBUR", "BILIRUBINUR", "KETONESUR", "PROTEINUR", "UROBILINOGEN", "NITRITE", "LEUKOCYTESUR" Sepsis Labs: @LABRCNTIP (procalcitonin:4,lacticidven:4) )No results found for this or any previous visit (from the past 240 hour(s)).   Radiological Exams on Admission: DG Chest Port 1 View  Result Date: 09/16/2022 CLINICAL DATA:  Altered EXAM: PORTABLE CHEST 1 VIEW COMPARISON:  06/19/2022, 01/01/2022, 07/10/2022 FINDINGS: Low lung  volumes with chronic elevation of right diaphragm. Stable cardiomediastinal silhouette. Atelectasis and or scarring at the bases. Slight increased airspace disease at left base. Aortic atherosclerosis IMPRESSION: Low lung volumes with chronic elevation of right diaphragm. Slight increased airspace disease at the left base, worsened atelectasis versus mild pneumonia Electronically Signed   By: Donavan Foil M.D.   On: 10/01/2022 21:46    EKG: Independently reviewed. Sinus rhythm, non-specific IVCD.   Assessment/Plan  1. Agitation  - Patient has been increasingly combative and trying to strike family members despite Ativan at home  - Haldol given in ED with some improvement  - Continue supportive care, delirium precautions, safety sitter, and as-needed Haldol, consult  palliative care   2. AKI superimposed on CKD 3b; hyperkalemia; acidosis  - BUN is 113 and SCr 6.10 on admission, up from 30 & 1.49 in December 2023  - Potassium is 6.8 with mild peaking of T-waves and QRS 121 ms (previously normal)  - Serum bicarbonate is 14   - Considered renal US but patient would not want invasive procedure  - Start isotonic bicarbonate infusion, treat hyperkalemia with IVF, calcium, insulin/dextrose, Lokelma, and bicarbonate, renally-dose medications, hold lisinopril and Lasix, repeat chem panel in am   3. Hypothermia  - Likely d/t uremia; no convincing evidence for infection (still waiting on UA) - Continue warming blanket as needed   4. Anemia; occult GI bleeding  - Hgb is 6.7 in ED (11.5 in December 2023), FOBT is positive  - Pt's family state that he would not want blood transfusion  - BUN is significantly increased but so is SCr  - Start PPI   5. Hypertension  - BP low-normal in ED and antihypertensives held on admission  6. Type II DM  - Hold metformin and Actos, check CBGs, and use low-intensity SSI if needed      DVT prophylaxis: SCDs  Code Status: DNR  Level of Care: Level of care: Progressive Family Communication: Grandson by phone  Disposition Plan:  Patient is from: Home   Anticipated d/c is to: TBD Anticipated d/c date is: Possibly as early as 10/03/22  Patient currently: Pending palliative care consultation, treatment of agitation  Consults called: None   Admission status: Observation     Vianne Bulls, MD [facility]  10/03/2022, 1:12 AM

## 2022-10-03 NOTE — ED Notes (Addendum)
ED TO INPATIENT HANDOFF REPORT  ED Nurse Name and Phone #: Marliss Czar [phone]  S Name/Age/Gender Darin Moran 89 y.o. male Room/Bed: 025C/025C  Code Status   Code Status: DNR  Home/SNF/Other Home  Is this baseline? Unsure  Triage Complete: Triage complete  Chief Complaint Acute renal failure superimposed on stage 3b chronic kidney disease, unspecified acute renal failure type ([place]) [N17.9, N18.32]  Triage Note Pt presents to ED 25 via [facility] from home with above complaint.  Per family, pt is more altered than normal.  Pt responsive to voice and able to state his legs hurt.  Wounds/scabs noted to legs.  Per EMS, pt takes Ativan at home and was given his Ativan per family.  Pt's grandson is his caregiver.    Pt received 500 mL fluid with EMS.  Mihran Leiba (Grandson/Caregiver): [phone]   Allergies Allergies  Allergen Reactions   Nsaids Other (See Comments)    Level of Care/Admitting Diagnosis ED Disposition     ED Disposition  Admit   Condition  --   [place]: [place] [[id number]]  Level of Care: Progressive [102]  Admit to Progressive based on following criteria: MULTISYSTEM THREATS such as stable sepsis, metabolic/electrolyte imbalance with or without encephalopathy that is responding to early treatment.  May place patient in observation at [facility] or [facility] if equivalent level of care is available:: Yes  Covid Evaluation: Asymptomatic - no recent exposure (last 10 days) testing not required  Diagnosis: Acute renal failure superimposed on stage 3b chronic kidney disease, unspecified acute renal failure type [facility]) [id number]  Admitting Physician: Vianne Bulls [id number]  Attending Physician: Vianne Bulls [id number]          B Medical/Surgery History Past Medical History:  Diagnosis Date   Arthritis    Diabetes mellitus without complication ([place])    Hypertension    Past Surgical History:   Procedure Laterality Date   CATARACT EXTRACTION W/PHACO Left 11/21/2019   Procedure: CATARACT EXTRACTION PHACO AND INTRAOCULAR LENS PLACEMENT (IOC) CDE:  15.38;  Surgeon: Baruch Goldmann, MD;  Location: [facility];  Service: Ophthalmology;  Laterality: Left;   CATARACT EXTRACTION W/PHACO Right 12/05/2019   Procedure: CATARACT EXTRACTION PHACO AND INTRAOCULAR LENS PLACEMENT RIGHT EYE;  Surgeon: Baruch Goldmann, MD;  Location: [facility];  Service: Ophthalmology;  Laterality: Right;  CDE: 14.04     A IV Location/Drains/Wounds Patient Lines/Drains/Airways Status     Active Line/Drains/Airways     Name Placement date Placement time Site Days   Peripheral IV 09/29/2022 18 G Left Forearm 09/16/2022  --  Forearm  1   Incision (Closed) 11/21/19 Eye Left 11/21/19  1126  -- 1047   Incision (Closed) 12/05/19 Eye Right 12/05/19  0952  -- 1033            Intake/Output Last 24 hours No intake or output data in the 24 hours ending 10/03/22 0013  Labs/Imaging Results for orders placed or performed during the hospital encounter of 09/18/2022 (from the past 48 hour(s))  POC occult blood, ED Provider will collect     Status: Abnormal   Collection Time: 10/03/2022  9:12 PM  Result Value Ref Range   Fecal Occult Bld POSITIVE (A) NEGATIVE  CBC with Differential     Status: Abnormal   Collection Time: 09/17/2022  9:40 PM  Result Value Ref Range   WBC 6.6 4.0 - 10.5 K/uL   RBC 2.26 (L) 4.22 - 5.81 MIL/uL   Hemoglobin  6.7 (LL) 13.0 - 17.0 g/dL    Comment: This critical result has verified and been called to Alinda Deem RN by Lynne Logan on 03 21 2024 at 2242, and has been read back.  REPEATED TO VERIFY CORRECTED ON 03/21 AT 2243: PREVIOUSLY REPORTED AS 6.7 This critical result has verified and been called to Alinda Deem RN by Lynne Logan on 03 21 2024 at 2242, and has been read back.     HCT 21.1 (L) 39.0 - 52.0 %   MCV 93.4 80.0 - 100.0 fL   MCH 29.6 26.0 - 34.0 pg   MCHC 31.8 30.0 - 36.0 g/dL   RDW  18.7 (H) 11.5 - 15.5 %   Platelets 222 150 - 400 K/uL   nRBC 0.0 0.0 - 0.2 %   Neutrophils Relative % 73 %   Neutro Abs 4.9 1.7 - 7.7 K/uL   Lymphocytes Relative 15 %   Lymphs Abs 1.0 0.7 - 4.0 K/uL   Monocytes Relative 9 %   Monocytes Absolute 0.6 0.1 - 1.0 K/uL   Eosinophils Relative 1 %   Eosinophils Absolute 0.0 0.0 - 0.5 K/uL   Basophils Relative 1 %   Basophils Absolute 0.0 0.0 - 0.1 K/uL   Immature Granulocytes 1 %   Abs Immature Granulocytes 0.06 0.00 - 0.07 K/uL    Comment: Performed at [facility] [address], [place], [place] [address]  Comprehensive metabolic panel     Status: Abnormal   Collection Time: 09/30/2022  9:40 PM  Result Value Ref Range   Sodium 132 (L) 135 - 145 mmol/L   Potassium 6.8 (HH) 3.5 - 5.1 mmol/L    Comment: CRITICAL RESULT CALLED TO, READ BACK BY AND VERIFIED WITH T.Demorio Seeley,RN. 2258 09/15/2022. LPAIT   Chloride 105 98 - 111 mmol/L   CO2 14 (L) 22 - 32 mmol/L   Glucose, Bld 115 (H) 70 - 99 mg/dL    Comment: Glucose reference range applies only to samples taken after fasting for at least 8 hours.   BUN 113 (H) 8 - 23 mg/dL   Creatinine, Ser 6.10 (H) 0.61 - 1.24 mg/dL   Calcium 8.4 (L) 8.9 - 10.3 mg/dL   Total Protein 5.4 (L) 6.5 - 8.1 g/dL   Albumin 2.7 (L) 3.5 - 5.0 g/dL   AST 33 15 - 41 U/L   ALT 16 0 - 44 U/L   Alkaline Phosphatase 50 38 - 126 U/L   Total Bilirubin 0.4 0.3 - 1.2 mg/dL   GFR, Estimated 8 (L) >60 mL/min    Comment: (NOTE) Calculated using the CKD-EPI Creatinine Equation (2021)    Anion gap 13 5 - 15    Comment: Performed at [facility], [place] [address] [place], [place] [address]   DG Chest Port 1 View  Result Date: 09/26/2022 CLINICAL DATA:  Altered EXAM: PORTABLE CHEST 1 VIEW COMPARISON:  06/19/2022, 01/01/2022, 07/10/2022 FINDINGS: Low lung volumes with chronic elevation of right diaphragm. Stable cardiomediastinal silhouette. Atelectasis and or scarring at the bases. Slight increased airspace disease at  left base. Aortic atherosclerosis IMPRESSION: Low lung volumes with chronic elevation of right diaphragm. Slight increased airspace disease at the left base, worsened atelectasis versus mild pneumonia Electronically Signed   By: Donavan Foil M.D.   On: 10/08/2022 21:46    Pending Labs Unresulted Labs (From admission, onward)     Start     Ordered   10/09/2022 2118  Blood culture (routine x 2)  BLOOD CULTURE X 2,   R (with STAT occurrences)     Question Answer Comment  Patient immune status Normal   Release to patient Immediate      09/16/2022 2117   09/18/2022 2118  Urinalysis, Routine w reflex microscopic -Urine, Clean Catch  Once,   URGENT       Question Answer Comment  Specimen Source Urine, Clean Catch   Release to patient Immediate      09/21/2022 2117            Vitals/Pain Today's Vitals   09/20/2022 2030 09/23/2022 2048 09/15/2022 2111 10/11/2022 2242  BP: 95/83     Pulse: 75     Resp: 18     Temp:  (!) 96 F (35.6 C) (!) 94.5 F (34.7 C) (!) 95.6 F (35.3 C)  TempSrc:  Axillary Rectal Rectal  SpO2: 98%     Weight:  72.6 kg    Height:  5\' 11"  (1.803 m)      Isolation Precautions No active isolations  Medications Medications  cefTRIAXone (ROCEPHIN) 1 g in sodium chloride 0.9 % 100 mL IVPB (has no administration in time range)  sodium chloride 0.9 % bolus 500 mL (has no administration in time range)  sodium zirconium cyclosilicate (LOKELMA) packet 10 g (10 g Oral Patient Refused/Not Given 10/01/2022 2333)  calcium gluconate 1 g/ 50 mL sodium chloride IVPB (1,000 mg Intravenous New Bag/Given 10/03/22 0008)  sodium bicarbonate injection 50 mEq (has no administration in time range)  sodium bicarbonate 150 mEq in sterile water 1,150 mL infusion (has no administration in time range)  pantoprazole (PROTONIX) injection 40 mg (has no administration in time range)  haloperidol lactate (HALDOL) injection 2 mg (2 mg Intravenous Given 09/24/2022 2325)  insulin aspart (novoLOG) injection 5  Units (5 Units Intravenous Given 10/03/22 0004)    And  dextrose 50 % solution 50 mL (50 mLs Intravenous Given 10/03/22 0006)    Mobility non-ambulatory     Focused Assessments   R Recommendations: See Admitting Provider Note  Report given to:   Additional Notes: Pt remains altered; unsure of baseline.  Pt in mitts due to pulling lines and tubing; pt removed 1 IV prior.  Pt getting meds for potassium shift at this time.  Wounds noted to bilat legs and back side as well.  Pt can be redirected at times but continues to pull equipment without mitts in place.  Pt hard of hearing.  Pt's grandson is his caregiver.  Number in initial triage note.

## 2022-10-03 NOTE — Inpatient Diabetes Management (Signed)
Inpatient Diabetes Program Recommendations  AACE/ADA: New Consensus Statement on Inpatient Glycemic Control (2015)  Target Ranges:  Prepandial:   less than 140 mg/dL      Peak postprandial:   less than 180 mg/dL (1-2 hours)      Critically ill patients:  140 - 180 mg/dL   Lab Results  Component Value Date   GLUCAP 113 (H) 10/03/2022    Latest Reference Range & Units 10/03/22 02:39 10/03/22 03:04 10/03/22 06:36  Glucose-Capillary 70 - 99 mg/dL 47 (L) 107 (H) 113 (H)  (L): Data is abnormally low (H): Data is abnormally high  Latest Reference Range & Units 10/03/22 04:42  Sodium 135 - 145 mmol/L 135  Potassium 3.5 - 5.1 mmol/L 5.9 (H)  Chloride 98 - 111 mmol/L 105  CO2 22 - 32 mmol/L 16 (L)  Glucose 70 - 99 mg/dL 101 (H)  BUN 8 - 23 mg/dL 107 (H)  Creatinine 0.61 - 1.24 mg/dL 5.84 (H)  Calcium 8.9 - 10.3 mg/dL 8.2 (L)  Anion gap 5 - 15  14  GFR, Estimated >60 mL/min 8 (L)  (H): Data is abnormally high (L): Data is abnormally low  Diabetes history: DM2 Outpatient Diabetes medications: Actos 45 mg qd, Metformin 500 mg bid Current orders for Inpatient glycemic control: Novolog 0-6 units tid  Inpatient Diabetes Program Recommendations:   Noted patient listed home meds Metformin and Actos which may not need @ discharge.  Thank you, Nani Gasser. Kendal Ghazarian, RN, MSN, CDE  Diabetes Coordinator Inpatient Glycemic Control Team Team Pager [phone] (8am-5pm) 10/03/2022 11:01 AM
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
## 2022-10-03 NOTE — Plan of Care (Signed)
  Problem: Coping: Goal: Level of anxiety will decrease Outcome: Progressing   Problem: Pain Managment: Goal: General experience of comfort will improve Outcome: Progressing   Problem: Safety: Goal: Ability to remain free from injury will improve Outcome: Progressing   

## 2022-10-03 NOTE — Consult Note (Signed)
Palliative Medicine Inpatient Consult Note  Consulting Provider: Vianne Bulls, MD   Reason for consult:   Darin Moran Palliative Medicine Consult  Reason for Consult? clarifying goals of care   10/03/2022  HPI:  Per intake H&P -->  Darin Moran is a 89 y.o. male with medical history significant for hypertension, type 2 diabetes mellitus, and arthritis who presents to the emergency department for increased confusion, falls, and combativeness.   Palliative care was consulted for goals of care conversations.  Of note, Darin Moran was enrolled in [facility] at home.   Clinical Assessment/Goals of Care:  *Please note that this is a verbal dictation therefore any spelling or grammatical errors are due to the "[place] [facility]" system interpretation.  I have reviewed medical records including EPIC notes, labs and imaging, received report from bedside RN, assessed the patient who is disoriented and falling asleep during my time at bedside.     I called patients grandson, Darin Moran to further discuss diagnosis prognosis, GOC, EOL wishes, disposition and options.   I introduced Palliative Medicine as specialized medical care for people living with serious illness. It focuses on providing relief from the symptoms and stress of a serious illness. The goal is to improve quality of life for both the patient and the family.  Medical History Review and Understanding:  A review of Darin Moran medical history inclusive of HTN, T2DM, arthritis, chronic cataracts was held.   Social History:  Darin Moran lives in [place], [place]. He is married. He has two children, his son died earlier this week and he is estranged from his daughter. He is retired and has a Therapist, sports.   Functional and Nutritional State:  Required help with bADLs at home. Has had a fair appetite.   Palliative Symptoms:  Agitation has been ongoing for three days.   Advance Directives:  A detailed  discussion was had today regarding advanced directives.  Yes, patients grandson will provide team a copy.  Code Status:  Concepts specific to code status, artifical feeding and hydration, continued IV antibiotics and rehospitalization was had.  The difference between a aggressive medical intervention path  and a palliative comfort care path for this patient at this time was had.   DNAR/DNI.   Discussion:  A discussion was held in the setting of Darin Moran's ongoing confusion. Reviewed the management of this in the hospital. Discussed patient has a GIB and renal failure. The goals at this time per grandson are quality of life and maintenance of dignity. Darin Moran shares that Darin Moran was on Hospice at home.  We discussed patients limited life expectancy in his present situation. We talked about transition to comfort measures in house and what that would entail inclusive of medications to control pain, dyspnea, agitation, nausea, itching, and hiccups.  We discussed stopping all uneccessary measures such as cardiac monitoring, blood draws, needle sticks, and frequent vital signs. Utilized reflective listening throughout our time together.  Darin Moran expresses how overwhelmed he is with discovering his uncle dead in his home three days ago.   Offered emotional support and guidance.   Plan for comfort oriented measures and to pursue transfer to inpatient hospice.   Discussed the importance of continued conversation with family and their  medical providers regarding overall plan of care and treatment options, ensuring decisions are within the context of the patients values and GOCs.  Decision Maker:  SUMMARY OF RECOMMENDATIONS   DNAR/DNI  Comfort Care  Ativan 0.5mg  IVP Q8H  Fentanyl 85mcg Q8H  Unrestricted visitation  Appreciate TOC reaching out to [facility] for inpatient hospice bed  Anticipate death within days d/t acute kidney failure and anemia w/ suspected active GIB  Ongoing PMT support  Code  Status/Advance Care Planning: DNAR/DNI  Palliative Prophylaxis:  Aspiration, Bowel Regimen, Delirium Protocol, Frequent Pain Assessment, Oral Care, Palliative Wound Care, and Turn Reposition  Additional Recommendations (Limitations, Scope, Preferences): Continue current care - Comfort measures  Psycho-social/Spiritual:  Desire for further Chaplaincy support: Patients Pastor's have been coming in Additional Recommendations: Ongoing support   Prognosis: Limited to days  Discharge Planning: Discharge to [facility] once able  Vitals:   10/03/22 0900 10/03/22 1000  BP: (!) 120/106 106/86  Pulse: 89 96  Resp: 19 15  Temp:    SpO2: 96% 95%    Intake/Output Summary (Last 24 hours) at 10/03/2022 1050 Last data filed at 10/03/2022 0820 Gross per 24 hour  Intake 3684.15 ml  Output 0 ml  Net 3684.15 ml   Last Weight  Most recent update: 10/03/2022  2:14 AM    Weight  71.3 kg (157 lb 3 oz)            Gen:  Elderly Caucasian M in NAD HEENT: Dry mucous membranes CV: Regular rate and rhythm  PULM: On RA, breathing is even and nonlabored ABD: soft/nontender  EXT: Venous stasis Neuro: Disoriented - hard of hearing with hearing aids  PPS: 10%   This conversation/these recommendations were discussed with patient primary care team, Dr. Maylene Moran  Billing based on MDM: High ______________________________________________________ [place] Team Team Cell Phone: [phone] Please utilize secure chat with additional questions, if there is no response within 30 minutes please call the above phone number  Palliative Medicine Team providers are available by phone from 7am to 7pm daily and can be reached through the team cell phone.  Should this patient require assistance outside of these hours, please call the patient's attending physician.

## 2022-10-04 DIAGNOSIS — N179 Acute kidney failure, unspecified: Secondary | ICD-10-CM | POA: Diagnosis not present

## 2022-10-04 DIAGNOSIS — N1832 Chronic kidney disease, stage 3b: Secondary | ICD-10-CM | POA: Diagnosis not present

## 2022-10-04 DIAGNOSIS — Z515 Encounter for palliative care: Secondary | ICD-10-CM | POA: Diagnosis not present

## 2022-10-04 MED ORDER — LORAZEPAM 2 MG/ML IJ SOLN: 0.5000 mg | Freq: Four times a day (QID) | INTRAMUSCULAR | Status: DC

## 2022-10-04 MED ORDER — HYDROMORPHONE HCL 1 MG/ML IJ SOLN: 0.5000 mg | INTRAMUSCULAR | Status: DC | PRN

## 2022-10-04 MED ORDER — HYDROMORPHONE HCL 1 MG/ML IJ SOLN
1.0000 mg | Freq: Four times a day (QID) | INTRAMUSCULAR | Status: DC
  Administered 2022-10-04 (×2): 1 mg via INTRAVENOUS
  Filled 2022-10-04 (×2): qty 1

## 2022-10-04 MED ORDER — FENTANYL CITRATE PF 50 MCG/ML IJ SOSY: 25.0000 ug | PREFILLED_SYRINGE | Freq: Four times a day (QID) | INTRAMUSCULAR | Status: DC
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
## 2022-10-04 NOTE — Progress Notes (Addendum)
   Palliative Medicine Inpatient Follow Up Note HPI: Darin Moran is a 89 y.o. male with medical history significant for hypertension, type 2 diabetes mellitus, and arthritis who presents to the emergency department for increased confusion, falls, and combativeness.    Palliative care was consulted for goals of care conversations.   Of note, Darin Moran was enrolled in [facility] at home.   Today's Discussion 10/04/2022  *Please note that this is a verbal dictation therefore any spelling or grammatical errors are due to the "[place] [facility]" system interpretation.  Chart reviewed inclusive of vital signs, progress notes, laboratory results, and diagnostic images.   I met at bedside with Darin Moran this morning. Per chart review he is more hypothermic. Upon assessment he had some myoclonic jerking and was breathing heavily at an increased cadence. Distal digits cool.  Per secure chat with RN, Darin Moran has not had much improvement in his breathing/symptoms with fentanyl. We discussed changing him to dilaudid. Also reviewed increasing ATC medications to every 6 hours.   No family at bedside this morning however I will call patients grandson, Darin Moran and provide a daily update.   As of yesterday [facility] had no bed availability.   Palliative Support Provided.  ____________________________________ Addendum:  I was able to meet with patients grandson, Darin Moran this afternoon. Provided a comprehensive Palliative update. Shared that patient is likely to pass in house, Reviewed symptom management medications.  Objective Assessment: Vital Signs Vitals:   10/04/22 0705 10/04/22 0720  BP: (!) 89/44   Pulse: 88 89  Resp: 19 17  Temp:  (!) 94 F (34.4 C)  SpO2: 98% 95%    Intake/Output Summary (Last 24 hours) at 10/04/2022 0756 Last data filed at 10/04/2022 0720 Gross per 24 hour  Intake 2262.72 ml  Output 250 ml  Net 2012.72 ml   Last Weight  Most recent update: 10/03/2022   2:14 AM    Weight  71.3 kg (157 lb 3 oz)            Gen:  Elderly Caucasian M in NAD HEENT: Dry mucous membranes CV: Regular rate and rhythm  PULM: On RA, breathing is laborous ABD: soft/nontender  EXT: Venous stasis, cool distal digits Neuro: Somnolent  SUMMARY OF RECOMMENDATIONS   DNAR/DNI   Comfort Care   Ativan 0.5mg  IVP Q6H   Dilaudid Q6H   Unrestricted visitation   Appreciate TOC reaching out to [facility] for inpatient hospice bed - Presently [facility] does not presently have bed   Anticipate death within days d/t acute kidney failure and anemia w/ suspected active GIB   Ongoing PMT support  Billing based on MDM: High ______________________________________________________________________________________ [place] Team Team Cell Phone: [phone] Please utilize secure chat with additional questions, if there is no response within 30 minutes please call the above phone number  Palliative Medicine Team providers are available by phone from 7am to 7pm daily and can be reached through the team cell phone.  Should this patient require assistance outside of these hours, please call the patient's attending physician.

## 2022-10-04 NOTE — Plan of Care (Signed)
  Problem: Pain Management: Goal: Satisfaction with pain management regimen will improve Outcome: Progressing   

## 2022-10-05 DIAGNOSIS — N179 Acute kidney failure, unspecified: Secondary | ICD-10-CM | POA: Diagnosis not present

## 2022-10-05 DIAGNOSIS — N1832 Chronic kidney disease, stage 3b: Secondary | ICD-10-CM | POA: Diagnosis not present

## 2022-10-05 DIAGNOSIS — Z7189 Other specified counseling: Secondary | ICD-10-CM | POA: Diagnosis not present

## 2022-10-05 DIAGNOSIS — Z515 Encounter for palliative care: Secondary | ICD-10-CM | POA: Diagnosis not present

## 2022-10-07 LAB — CULTURE, BLOOD (ROUTINE X 2)
Culture: NO GROWTH
Culture: NO GROWTH
Special Requests: ADEQUATE
Special Requests: ADEQUATE
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
## 2022-10-13 NOTE — Progress Notes (Signed)
   Palliative Medicine Inpatient Follow Up Note HPI: Darin Moran is a 89 y.o. male with medical history significant for hypertension, type 2 diabetes mellitus, and arthritis who presents to the emergency department for increased confusion, falls, and combativeness.    Palliative care was consulted for goals of care conversations.   Of note, Darin Moran was enrolled in [facility] at home.   Today's Discussion Oct 18, 2022  *Please note that this is a verbal dictation therefore any spelling or grammatical errors are due to the "[place] One" system interpretation.  Chart reviewed inclusive of vital signs, progress notes, laboratory results, and diagnostic images.   I met at bedside with patients RN, Einar Pheasant. Per Einar Pheasant he has had a restful night. He appears to be in no distress at my time of assessment. He has weak, thready radial pulses. He has depressed breathing.   At this time patients current medication orders are aiding in his overall comfort.   No Family at bedside though I will reach out to his grandson, [name] this morning to provide an update.   Palliative Support Provided.   Objective Assessment: Vital Signs Vitals:   10-18-22 0721 10-18-2022 0723  BP:    Pulse:    Resp: (!) 0 (!) 0  Temp:    SpO2:      Intake/Output Summary (Last 24 hours) at 10-18-22 [id number] Last data filed at 10/04/2022 1400 Gross per 24 hour  Intake --  Output 150 ml  Net -150 ml    Last Weight  Most recent update: Oct 18, 2022  7:28 AM    Weight  72.6 kg (160 lb 0.9 oz)            Gen:  Elderly Caucasian M in NAD HEENT: Dry mucous membranes CV: Regular rate and rhythm  PULM: On RA, breathing is laborous ABD: soft/nontender  EXT: Venous stasis, cool distal digits Neuro: Somnolent  SUMMARY OF RECOMMENDATIONS   DNAR/DNI   Comfort Care   Ativan 0.5mg  IVP Q6H   Dilaudid Q6H   Unrestricted visitation   Appreciate TOC reaching out to [facility] for inpatient hospice bed - Presently  [facility] does not presently have bed   Anticipate death within hours Ongoing PMT support  Billing based on MDM: High ______________________________________________________________________________________ [place] Team Team Cell Phone: [phone] Please utilize secure chat with additional questions, if there is no response within 30 minutes please call the above phone number  Palliative Medicine Team providers are available by phone from 7am to 7pm daily and can be reached through the team cell phone.  Should this patient require assistance outside of these hours, please call the patient's attending physician.

## 2022-10-13 DEATH — deceased

## 2022-12-30 IMAGING — DX DG LUMBAR SPINE COMPLETE 4+V
5 series · 5 of 5 positions shown · non-contrast
Comparison: CT 11/25/2013

CLINICAL DATA: Bilateral lower back pain, radiating pain below the
knees, severe spinal canal stenosis on prior CT.

EXAM:
LUMBAR SPINE - COMPLETE 4+ VIEW

[l-spine ap]
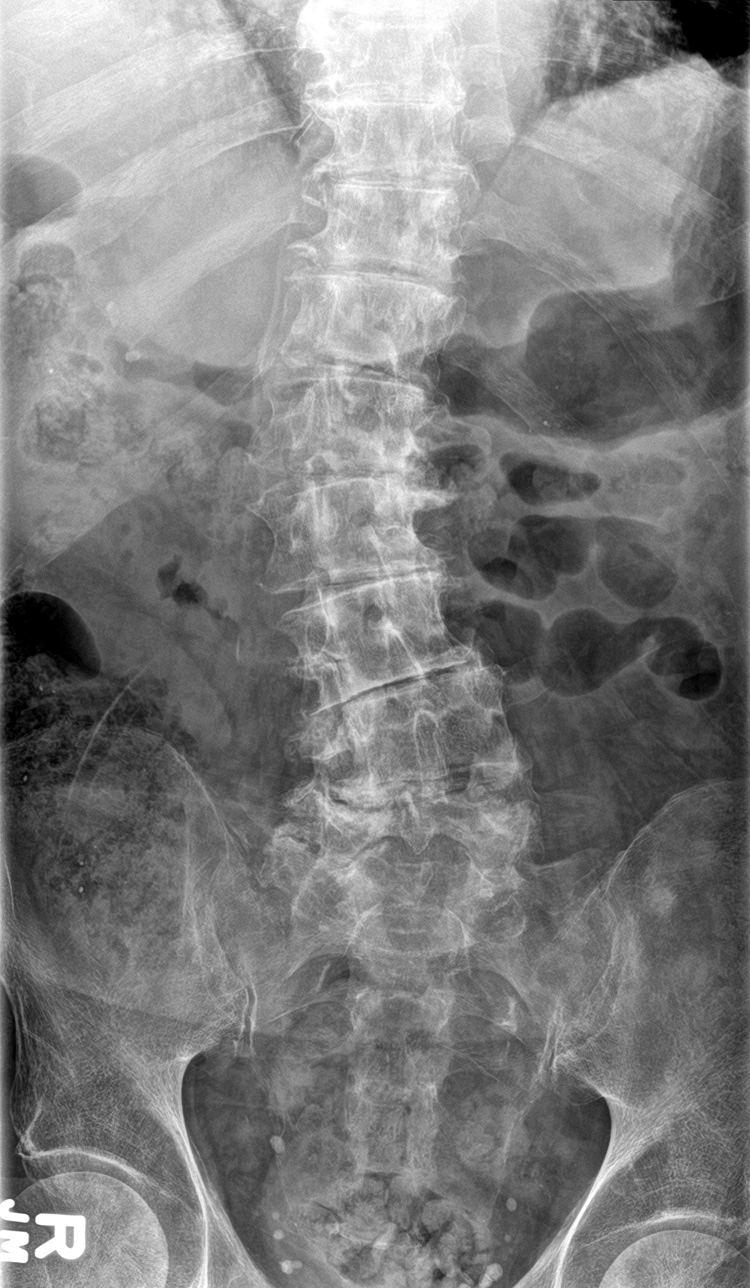

[l-spine obl (1 of 2)]
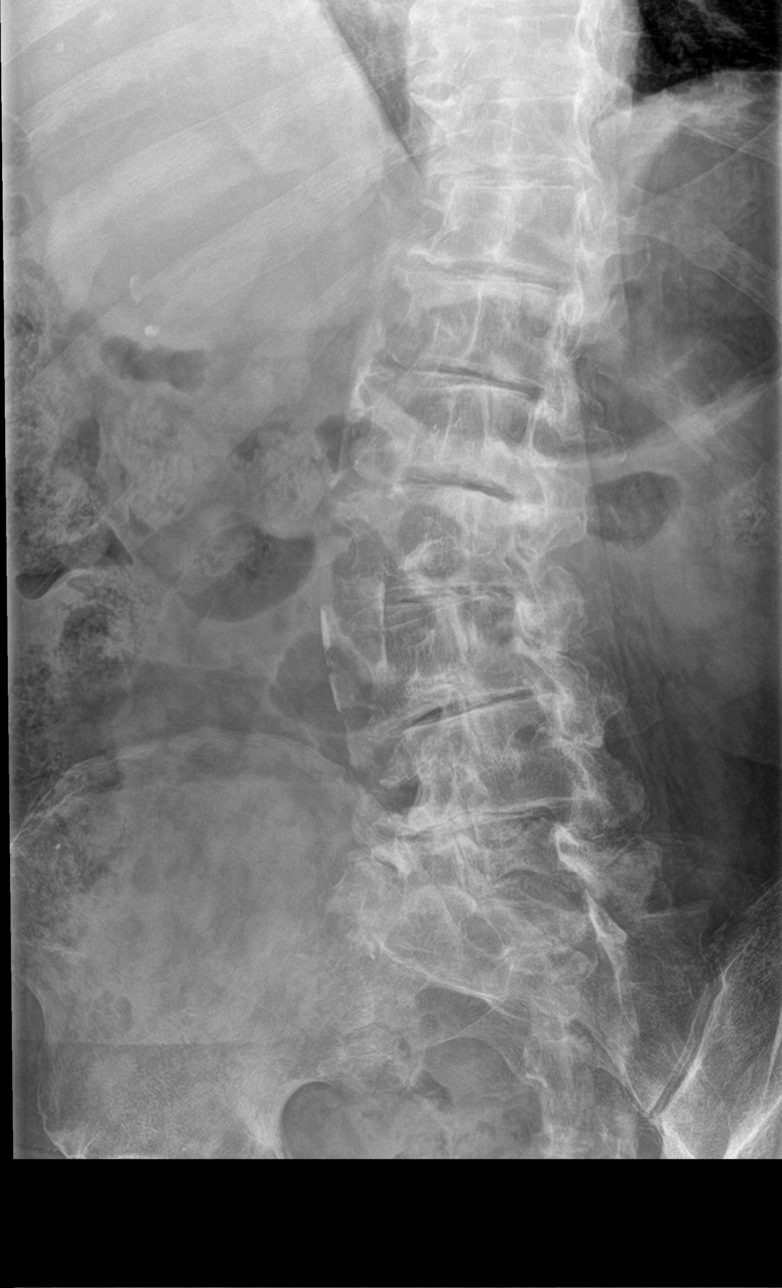

[l-spine obl (2 of 2)]
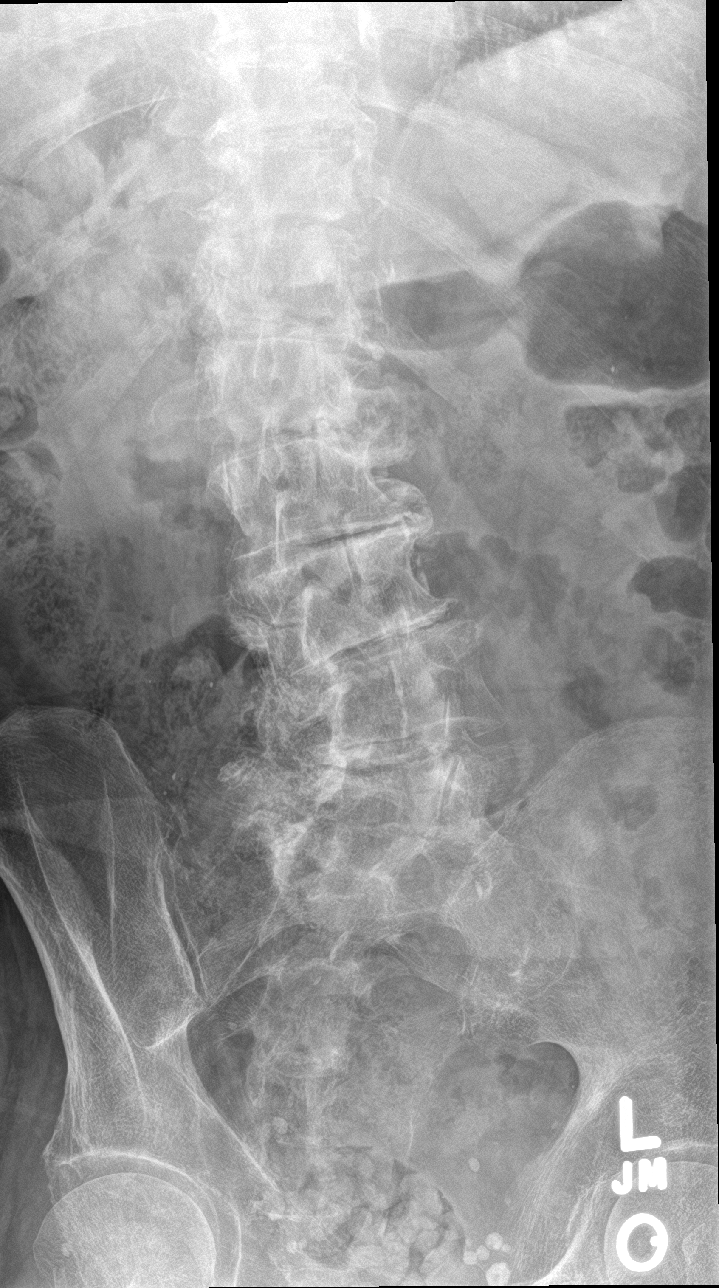

[l-spine lat]
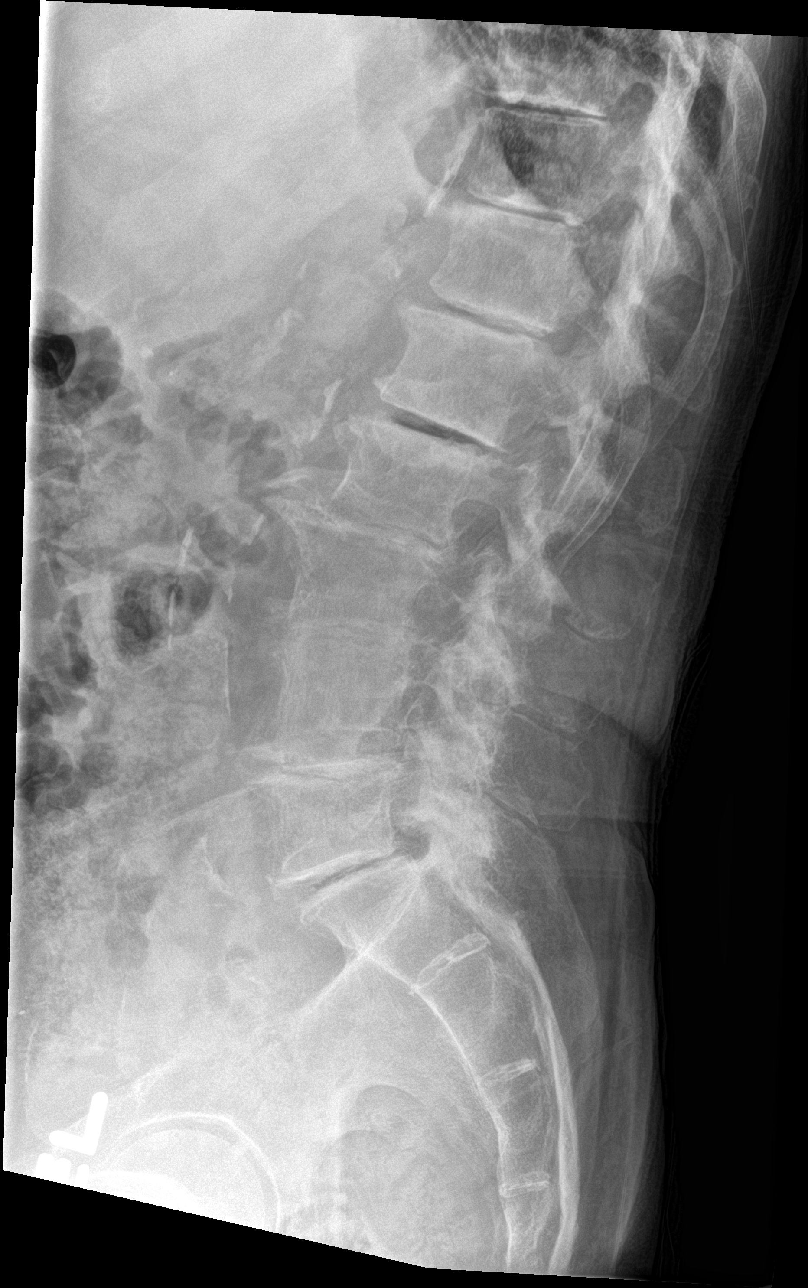

[l-spine spot]
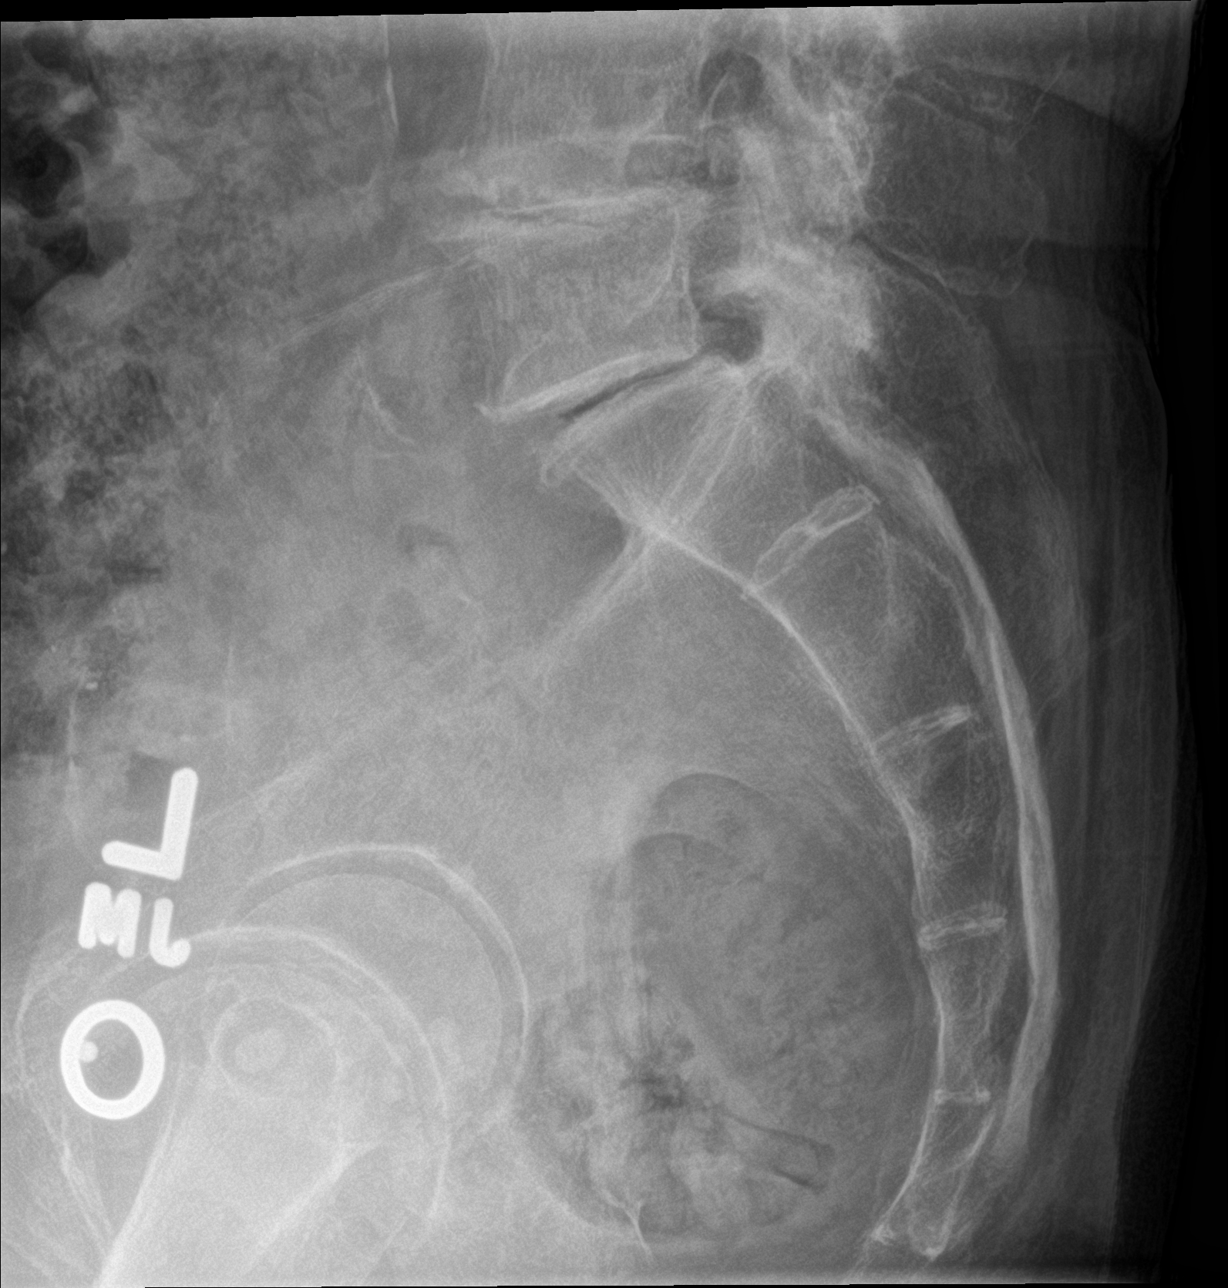

[5 of 5 positions shown; findings below may reference images not displayed]

FINDINGS: The osseous structures appear diffusely demineralized which may
limit detection of small or nondisplaced fractures. Evaluation
further limited by marked multilevel degenerative changes and
scoliotic curvature with dextrocurvature apex at the L2 level. No
visible displaced fractures or vertebral body height loss is seen.
Extensive multilevel discogenic and facet degenerative changes are
present throughout the lumbar levels including possible bony fusion
across the L3-4 disc space. Findings likely result in multilevel
spinal canal stenosis and neural foraminal narrowing, incompletely
ascertained on these radiographic images. Diffuse interspinous
arthrosis present as well compatible with Baastrup's disease.
Atherosclerotic calcification of the abdominal aorta. Calcified
phleboliths in the pelvis. No other acute or is conspicuous soft
tissue abnormalities.
IMPRESSION: 1. Limited evaluation due to bony demineralization, marked
multilevel degenerative changes and scoliotic curvature.
2. No visible displaced fractures or vertebral body height loss.
3. Findings likely result in multilevel spinal canal stenosis and
neural foraminal narrowing, incompletely ascertained on these
radiographic images.
